# Patient Record
Sex: Female | Born: 1955
Health system: Southern US, Community
[De-identification: ages and names within clinical notes are randomized; demographics above are authoritative.]

## PROBLEM LIST (undated history)

## (undated) DIAGNOSIS — G4730 Sleep apnea, unspecified: Secondary | ICD-10-CM

## (undated) DIAGNOSIS — I2510 Atherosclerotic heart disease of native coronary artery without angina pectoris: Secondary | ICD-10-CM

## (undated) DIAGNOSIS — N289 Disorder of kidney and ureter, unspecified: Secondary | ICD-10-CM

## (undated) DIAGNOSIS — F419 Anxiety disorder, unspecified: Secondary | ICD-10-CM

## (undated) DIAGNOSIS — J449 Chronic obstructive pulmonary disease, unspecified: Secondary | ICD-10-CM

## (undated) HISTORY — PX: CHOLECYSTECTOMY: SHX55

## (undated) HISTORY — PX: OTHER SURGICAL HISTORY: SHX169

## (undated) HISTORY — PX: APPENDECTOMY: SHX54

---

## 2010-09-15 HISTORY — PX: CORONARY ANGIOPLASTY: SHX604

## 2016-03-08 DIAGNOSIS — K56609 Unspecified intestinal obstruction, unspecified as to partial versus complete obstruction: Secondary | ICD-10-CM

## 2016-03-08 HISTORY — DX: Unspecified intestinal obstruction, unspecified as to partial versus complete obstruction: K56.609

## 2016-04-24 DIAGNOSIS — N200 Calculus of kidney: Secondary | ICD-10-CM

## 2016-04-24 DIAGNOSIS — N390 Urinary tract infection, site not specified: Secondary | ICD-10-CM

## 2016-04-24 HISTORY — DX: Urinary tract infection, site not specified: N39.0

## 2016-04-24 HISTORY — DX: Calculus of kidney: N20.0

## 2020-04-20 DIAGNOSIS — E7800 Pure hypercholesterolemia, unspecified: Secondary | ICD-10-CM | POA: Diagnosis not present

## 2020-04-20 DIAGNOSIS — E782 Mixed hyperlipidemia: Secondary | ICD-10-CM | POA: Diagnosis not present

## 2020-04-20 DIAGNOSIS — E785 Hyperlipidemia, unspecified: Secondary | ICD-10-CM | POA: Diagnosis not present

## 2020-04-20 DIAGNOSIS — H409 Unspecified glaucoma: Secondary | ICD-10-CM | POA: Diagnosis not present

## 2020-06-14 DIAGNOSIS — H6122 Impacted cerumen, left ear: Secondary | ICD-10-CM | POA: Diagnosis not present

## 2020-06-14 DIAGNOSIS — J40 Bronchitis, not specified as acute or chronic: Secondary | ICD-10-CM | POA: Diagnosis not present

## 2020-06-14 DIAGNOSIS — H9192 Unspecified hearing loss, left ear: Secondary | ICD-10-CM | POA: Diagnosis not present

## 2020-06-14 DIAGNOSIS — R059 Cough, unspecified: Secondary | ICD-10-CM | POA: Diagnosis not present

## 2020-08-19 DIAGNOSIS — Z720 Tobacco use: Secondary | ICD-10-CM | POA: Diagnosis not present

## 2020-08-19 DIAGNOSIS — I2510 Atherosclerotic heart disease of native coronary artery without angina pectoris: Secondary | ICD-10-CM | POA: Diagnosis not present

## 2020-08-19 DIAGNOSIS — J449 Chronic obstructive pulmonary disease, unspecified: Secondary | ICD-10-CM | POA: Diagnosis not present

## 2020-08-19 DIAGNOSIS — N1831 Chronic kidney disease, stage 3a: Secondary | ICD-10-CM | POA: Diagnosis not present

## 2020-09-01 DIAGNOSIS — J069 Acute upper respiratory infection, unspecified: Secondary | ICD-10-CM | POA: Diagnosis not present

## 2020-09-09 DIAGNOSIS — Z20822 Contact with and (suspected) exposure to covid-19: Secondary | ICD-10-CM | POA: Diagnosis not present

## 2020-09-09 DIAGNOSIS — J189 Pneumonia, unspecified organism: Secondary | ICD-10-CM | POA: Diagnosis not present

## 2020-09-09 DIAGNOSIS — R509 Fever, unspecified: Secondary | ICD-10-CM | POA: Diagnosis not present

## 2020-09-09 DIAGNOSIS — R0602 Shortness of breath: Secondary | ICD-10-CM | POA: Diagnosis not present

## 2020-09-09 DIAGNOSIS — J441 Chronic obstructive pulmonary disease with (acute) exacerbation: Secondary | ICD-10-CM | POA: Diagnosis not present

## 2020-09-27 DIAGNOSIS — Z01419 Encounter for gynecological examination (general) (routine) without abnormal findings: Secondary | ICD-10-CM | POA: Diagnosis not present

## 2020-09-27 DIAGNOSIS — Z1231 Encounter for screening mammogram for malignant neoplasm of breast: Secondary | ICD-10-CM | POA: Diagnosis not present

## 2020-09-27 DIAGNOSIS — F17210 Nicotine dependence, cigarettes, uncomplicated: Secondary | ICD-10-CM | POA: Diagnosis not present

## 2020-10-24 DIAGNOSIS — Z8701 Personal history of pneumonia (recurrent): Secondary | ICD-10-CM | POA: Diagnosis not present

## 2020-10-24 DIAGNOSIS — M545 Low back pain, unspecified: Secondary | ICD-10-CM | POA: Diagnosis not present

## 2020-10-24 DIAGNOSIS — F419 Anxiety disorder, unspecified: Secondary | ICD-10-CM | POA: Diagnosis not present

## 2020-10-24 DIAGNOSIS — I10 Essential (primary) hypertension: Secondary | ICD-10-CM | POA: Diagnosis not present

## 2020-11-07 DIAGNOSIS — J449 Chronic obstructive pulmonary disease, unspecified: Secondary | ICD-10-CM | POA: Diagnosis not present

## 2020-12-08 DIAGNOSIS — J449 Chronic obstructive pulmonary disease, unspecified: Secondary | ICD-10-CM | POA: Diagnosis not present

## 2020-12-13 DIAGNOSIS — R059 Cough, unspecified: Secondary | ICD-10-CM | POA: Diagnosis not present

## 2020-12-13 DIAGNOSIS — J029 Acute pharyngitis, unspecified: Secondary | ICD-10-CM | POA: Diagnosis not present

## 2020-12-13 DIAGNOSIS — Z20822 Contact with and (suspected) exposure to covid-19: Secondary | ICD-10-CM | POA: Diagnosis not present

## 2020-12-13 DIAGNOSIS — R0602 Shortness of breath: Secondary | ICD-10-CM | POA: Diagnosis not present

## 2021-01-08 DIAGNOSIS — J449 Chronic obstructive pulmonary disease, unspecified: Secondary | ICD-10-CM | POA: Diagnosis not present

## 2021-01-24 DIAGNOSIS — R0609 Other forms of dyspnea: Secondary | ICD-10-CM | POA: Diagnosis not present

## 2021-01-24 DIAGNOSIS — Z8701 Personal history of pneumonia (recurrent): Secondary | ICD-10-CM | POA: Diagnosis not present

## 2021-01-24 DIAGNOSIS — F17210 Nicotine dependence, cigarettes, uncomplicated: Secondary | ICD-10-CM | POA: Diagnosis not present

## 2021-01-24 DIAGNOSIS — R9389 Abnormal findings on diagnostic imaging of other specified body structures: Secondary | ICD-10-CM | POA: Diagnosis not present

## 2021-01-25 DIAGNOSIS — M25551 Pain in right hip: Secondary | ICD-10-CM | POA: Diagnosis not present

## 2021-01-25 DIAGNOSIS — Z23 Encounter for immunization: Secondary | ICD-10-CM | POA: Diagnosis not present

## 2021-01-25 DIAGNOSIS — F419 Anxiety disorder, unspecified: Secondary | ICD-10-CM | POA: Diagnosis not present

## 2021-01-25 DIAGNOSIS — R519 Headache, unspecified: Secondary | ICD-10-CM | POA: Diagnosis not present

## 2021-01-30 DIAGNOSIS — A881 Epidemic vertigo: Secondary | ICD-10-CM | POA: Diagnosis not present

## 2021-01-30 DIAGNOSIS — R519 Headache, unspecified: Secondary | ICD-10-CM | POA: Diagnosis not present

## 2021-02-07 DIAGNOSIS — R9389 Abnormal findings on diagnostic imaging of other specified body structures: Secondary | ICD-10-CM | POA: Diagnosis not present

## 2021-02-07 DIAGNOSIS — J449 Chronic obstructive pulmonary disease, unspecified: Secondary | ICD-10-CM | POA: Diagnosis not present

## 2021-02-28 DIAGNOSIS — J84112 Idiopathic pulmonary fibrosis: Secondary | ICD-10-CM | POA: Diagnosis not present

## 2021-02-28 DIAGNOSIS — J849 Interstitial pulmonary disease, unspecified: Secondary | ICD-10-CM | POA: Diagnosis not present

## 2021-02-28 DIAGNOSIS — R9389 Abnormal findings on diagnostic imaging of other specified body structures: Secondary | ICD-10-CM | POA: Diagnosis not present

## 2021-02-28 DIAGNOSIS — R0600 Dyspnea, unspecified: Secondary | ICD-10-CM | POA: Diagnosis not present

## 2021-03-10 DIAGNOSIS — J449 Chronic obstructive pulmonary disease, unspecified: Secondary | ICD-10-CM | POA: Diagnosis not present

## 2021-04-09 DIAGNOSIS — J449 Chronic obstructive pulmonary disease, unspecified: Secondary | ICD-10-CM | POA: Diagnosis not present

## 2021-04-10 ENCOUNTER — Encounter (HOSPITAL_COMMUNITY): Payer: Self-pay

## 2021-04-10 ENCOUNTER — Emergency Department (HOSPITAL_COMMUNITY)
Admission: EM | Admit: 2021-04-10 | Discharge: 2021-04-10 | Disposition: A | Discharge status: Home / Self Care | Payer: Medicare Other | Attending: Emergency Medicine | Admitting: Emergency Medicine

## 2021-04-10 ENCOUNTER — Emergency Department (HOSPITAL_COMMUNITY): Payer: Medicare Other

## 2021-04-10 DIAGNOSIS — M545 Low back pain, unspecified: Secondary | ICD-10-CM | POA: Insufficient documentation

## 2021-04-10 DIAGNOSIS — R202 Paresthesia of skin: Secondary | ICD-10-CM | POA: Diagnosis not present

## 2021-04-10 DIAGNOSIS — M5136 Other intervertebral disc degeneration, lumbar region: Secondary | ICD-10-CM | POA: Diagnosis not present

## 2021-04-10 MED ORDER — LIDOCAINE 5 % EX PTCH: 1.0000 | MEDICATED_PATCH | CUTANEOUS | 0 refills | Status: DC

## 2021-04-10 MED ORDER — METHOCARBAMOL 500 MG PO TABS: 500.0000 mg | ORAL_TABLET | Freq: Two times a day (BID) | ORAL | 0 refills | Status: AC

## 2021-04-10 MED ORDER — KETOROLAC TROMETHAMINE 15 MG/ML IJ SOLN: 15.0000 mg | Freq: Once | INTRAMUSCULAR | Status: DC

## 2021-04-10 MED ORDER — PREDNISONE 10 MG (21) PO TBPK: ORAL_TABLET | Freq: Every day | ORAL | 0 refills | Status: DC

## 2021-04-10 MED ORDER — OXYCODONE-ACETAMINOPHEN 5-325 MG PO TABS: 1.0000 | ORAL_TABLET | Freq: Four times a day (QID) | ORAL | 0 refills | Status: AC | PRN

## 2021-04-10 MED ORDER — KETOROLAC TROMETHAMINE 15 MG/ML IJ SOLN
15.0000 mg | Freq: Once | INTRAMUSCULAR | Status: AC
  Administered 2021-04-10: 16:00:00 15 mg via INTRAMUSCULAR
  Filled 2021-04-10: qty 1

## 2021-04-10 MED ORDER — HYDROCODONE-ACETAMINOPHEN 5-325 MG PO TABS
1.0000 | ORAL_TABLET | Freq: Once | ORAL | Status: AC
  Administered 2021-04-10: 16:00:00 1 via ORAL
  Filled 2021-04-10: qty 1

## 2021-04-10 NOTE — ED Provider Notes (Signed)
[facility] EMERGENCY DEPARTMENT Provider Note   CSN: [id number] Arrival date & time: 04/10/21  1144     History Chief Complaint  Patient presents with   Back Pain   Leg Pain    bilat    Andrea Houston is a 65 y.o. female.  65 year old female presents today for evaluation of low back pain that radiates down to her bilateral lower legs 66-month duration that worsened 2 months ago.  Patient reports 5 months ago she says she was getting in and out of her RV she had multiple falls until the had stairs placed.  She has been taking over-the-counter medicine with some relief.  She denies fever, IV drug use history, history of malignancy, saddle anesthesia, lack of bowel or bladder control.  She does report numbness and tingling in bilateral feet otherwise denies complaints.  She also reports over the past couple months she occasionally has falls because her legs give out.  These episodes are random.  Most recently occurred about a week ago.  The history is provided by the patient. No language interpreter was used.      No past medical history on file.  There are no problems to display for this patient.   History reviewed. No pertinent surgical history.   OB History   No obstetric history on file.     No family history on file.     Home Medications Prior to Admission medications   Medication Sig Start Date End Date Taking? Authorizing Provider  lidocaine (LIDODERM) 5 % Place 1 patch onto the skin daily. Remove & Discard patch within 12 hours or as directed by MD 04/10/21  Yes Deatra Canter, Vyron Fronczak, PA-C  methocarbamol (ROBAXIN) 500 MG tablet Take 1 tablet (500 mg total) by mouth 2 (two) times daily. 04/10/21  Yes Deatra Canter, Ajanae Virag, PA-C  oxyCODONE-acetaminophen (PERCOCET/ROXICET) 5-325 MG tablet Take 1 tablet by mouth every 6 (six) hours as needed for up to 3 days for severe pain. 04/10/21 04/13/21 Yes Orvis Stann, PA-C  predniSONE (STERAPRED UNI-PAK 21 TAB) 10 MG (21) TBPK tablet Take by mouth  daily. Take 6 tabs by mouth daily  for 2 days, then 5 tabs for 2 days, then 4 tabs for 2 days, then 3 tabs for 2 days, 2 tabs for 2 days, then 1 tab by mouth daily for 2 days 04/10/21  Yes Deatra Canter, Kathalina Ostermann, PA-C    Allergies    Patient has no known allergies.  Review of Systems   Review of Systems  Constitutional:  Negative for chills and fever.  Respiratory:  Negative for shortness of breath.   Gastrointestinal:  Negative for nausea and vomiting.  Genitourinary:  Negative for difficulty urinating and dysuria.  Musculoskeletal:  Positive for back pain and gait problem.  Neurological:  Positive for numbness. Negative for weakness.  All other systems reviewed and are negative.  Physical Exam Updated Vital Signs BP (!) 105/93    Pulse 89    Temp 99.1 F (37.3 C) (Oral)    Resp 18    Ht 5\' 1"  (1.549 m)    Wt 67.1 kg    SpO2 96%    BMI 27.96 kg/m   Physical Exam Vitals and nursing note reviewed.  Constitutional:      General: She is not in acute distress.    Appearance: Normal appearance. She is not ill-appearing.  HENT:     Head: Normocephalic and atraumatic.     Nose: Nose normal.  Eyes:     General:  No scleral icterus.    Extraocular Movements: Extraocular movements intact.     Conjunctiva/sclera: Conjunctivae normal.  Cardiovascular:     Rate and Rhythm: Normal rate and regular rhythm.     Pulses: Normal pulses.     Heart sounds: Normal heart sounds.  Pulmonary:     Effort: Pulmonary effort is normal. No respiratory distress.     Breath sounds: Normal breath sounds. No wheezing or rales.  Abdominal:     General: There is no distension.     Tenderness: There is no abdominal tenderness.  Musculoskeletal:        General: Normal range of motion.     Cervical back: Normal range of motion.     Comments: Cervical and thoracic spine without tenderness palpation.  Lumbar spine with mild tenderness to palpation.  Right lumbar paraspinal muscles with significant tenderness to palpation  present.  Full hip, knee, ankle range of motion present in bilateral lower extremities.  5/5 strength in hip, knee, ankle present bilaterally.  Sensation altered in bilateral feet states it feels like pins-and-needles, otherwise intact and symmetrical.  DP pulse 2+ and symmetrical.  Skin:    General: Skin is warm and dry.  Neurological:     General: No focal deficit present.     Mental Status: She is alert. Mental status is at baseline.    ED Results / Procedures / Treatments   Labs (all labs ordered are listed, but only abnormal results are displayed) Labs Reviewed - No data to display  EKG None  Radiology DG Lumbar Spine 2-3 Views  Result Date: 04/10/2021 CLINICAL DATA:  Five months of back and bilateral leg pain. EXAM: LUMBAR SPINE - 2-3 VIEW COMPARISON:  None. FINDINGS: There is no evidence of lumbar spine fracture. Five lumbar type vertebral bodies. Multilevel disc space narrowing and facet hypertrophy most notably at L5-S1 which produces neural foraminal impingement. Grade 1 L4 on L5 degenerative anterolisthesis. Cholecystectomy clips. Aortic atherosclerosis. IMPRESSION: Multilevel degenerative changes of the lumbar spine. No acute osseous abnormality. Electronically Signed   By: Dahlia Bailiff M.D.   On: 04/10/2021 16:04    Procedures Procedures   Medications Ordered in ED Medications  HYDROcodone-acetaminophen (NORCO/VICODIN) 5-325 MG per tablet 1 tablet (1 tablet Oral Given 04/10/21 1603)  ketorolac (TORADOL) 15 MG/ML injection 15 mg (15 mg Intramuscular Given 04/10/21 1603)    ED Course  I have reviewed the triage vital signs and the nursing notes.  Pertinent labs & imaging results that were available during my care of the patient were reviewed by me and considered in my medical decision making (see chart for details).    MDM Rules/Calculators/A&P                         65 year old female presents today for evaluation of back pain with numbness and tingling of  bilateral feet.  She describes muscle tightness in her anterior leg otherwise without sharp shooting pain down either of the legs.  Straight leg raise test negative bilaterally.  Given the occurrences of occasional falls, and numbness to her bilateral feet we will provide patient with neurosurgery follow-up.  Discussed importance of calling and scheduling an appointment soon as possible as well as following up with her primary care provider.  We will give patient Toradol prednisone taper along with muscle relaxer and pain medicine for pain relief.  Return precautions discussed with patient husband at length.  They both voiced understanding and are in agreement  with plan. Case discussed with Dr. Rubin Payor.      Final Clinical Impression(s) / ED Diagnoses Final diagnoses:  Acute right-sided low back pain without sciatica    Rx / DC Orders ED Discharge Orders          Ordered    predniSONE (STERAPRED UNI-PAK 21 TAB) 10 MG (21) TBPK tablet  Daily        04/10/21 1627    oxyCODONE-acetaminophen (PERCOCET/ROXICET) 5-325 MG tablet  Every 6 hours PRN        04/10/21 1627    methocarbamol (ROBAXIN) 500 MG tablet  2 times daily        04/10/21 1627    lidocaine (LIDODERM) 5 %  Every 24 hours        04/10/21 1627             Marita Kansas, PA-C 04/10/21 1706    Benjiman Core, MD 04/11/21 [phone]

## 2021-04-10 NOTE — ED Triage Notes (Signed)
Pt to ED via Triage c/o back pain and leg pain bilaterally over the past 5 months. Reports fell out of a boat aprox 5 months ago and this is when symptoms started.  Intermittent tingling and pain; which is worse  with ambulation.

## 2021-04-10 NOTE — Discharge Instructions (Signed)
Your x-ray did show some narrowing of your spine.  I have sent in a few medications for you to help with your back pain.  If your symptoms worsen, develop weakness, numbness in your groin, difficulty controlling your bowel or bladder please return to the emergency room.  Otherwise I have attached neurosurgery follow-up for you above.  Please give them a call to schedule an in clinic follow-up appointment.  In the meantime you can also follow with your primary care doctor to be reevaluated.

## 2021-04-11 NOTE — ED Provider Notes (Signed)
Received call from pharmacist, who was concerned that the patient got a prescription for oxycodone and is chronically on hydrocodone.  It does not appear that the provider recognized that, as there is no documentation in the note.  I asked the pharmacist to not fill the oxycodone and instructed patient to take her usual prescribed medication along with a new prescription for prednisone and Robaxin.   Mancel Bale, MD 04/12/21 1101

## 2021-04-18 DIAGNOSIS — M5416 Radiculopathy, lumbar region: Secondary | ICD-10-CM | POA: Diagnosis not present

## 2021-04-24 DIAGNOSIS — R0602 Shortness of breath: Secondary | ICD-10-CM | POA: Diagnosis not present

## 2021-04-24 DIAGNOSIS — J188 Other pneumonia, unspecified organism: Secondary | ICD-10-CM | POA: Diagnosis not present

## 2021-04-24 DIAGNOSIS — R051 Acute cough: Secondary | ICD-10-CM | POA: Diagnosis not present

## 2021-04-28 DIAGNOSIS — M5416 Radiculopathy, lumbar region: Secondary | ICD-10-CM | POA: Diagnosis not present

## 2021-05-09 DIAGNOSIS — M5416 Radiculopathy, lumbar region: Secondary | ICD-10-CM | POA: Diagnosis not present

## 2021-05-16 DIAGNOSIS — E785 Hyperlipidemia, unspecified: Secondary | ICD-10-CM | POA: Diagnosis not present

## 2021-05-16 DIAGNOSIS — Z79899 Other long term (current) drug therapy: Secondary | ICD-10-CM | POA: Diagnosis not present

## 2021-05-16 DIAGNOSIS — I10 Essential (primary) hypertension: Secondary | ICD-10-CM | POA: Diagnosis not present

## 2021-05-16 DIAGNOSIS — J849 Interstitial pulmonary disease, unspecified: Secondary | ICD-10-CM | POA: Diagnosis not present

## 2021-05-16 DIAGNOSIS — F419 Anxiety disorder, unspecified: Secondary | ICD-10-CM | POA: Diagnosis not present

## 2021-05-25 DIAGNOSIS — U099 Post covid-19 condition, unspecified: Secondary | ICD-10-CM | POA: Diagnosis not present

## 2021-05-25 DIAGNOSIS — J849 Interstitial pulmonary disease, unspecified: Secondary | ICD-10-CM | POA: Diagnosis not present

## 2021-05-25 DIAGNOSIS — F17210 Nicotine dependence, cigarettes, uncomplicated: Secondary | ICD-10-CM | POA: Diagnosis not present

## 2021-05-26 DIAGNOSIS — J849 Interstitial pulmonary disease, unspecified: Secondary | ICD-10-CM | POA: Diagnosis not present

## 2021-06-08 DIAGNOSIS — M5416 Radiculopathy, lumbar region: Secondary | ICD-10-CM | POA: Diagnosis not present

## 2021-06-10 DIAGNOSIS — J449 Chronic obstructive pulmonary disease, unspecified: Secondary | ICD-10-CM | POA: Diagnosis not present

## 2021-07-06 DIAGNOSIS — I10 Essential (primary) hypertension: Secondary | ICD-10-CM | POA: Diagnosis not present

## 2021-07-06 DIAGNOSIS — M25551 Pain in right hip: Secondary | ICD-10-CM | POA: Diagnosis not present

## 2021-07-06 DIAGNOSIS — M4316 Spondylolisthesis, lumbar region: Secondary | ICD-10-CM | POA: Diagnosis not present

## 2021-07-06 DIAGNOSIS — M5416 Radiculopathy, lumbar region: Secondary | ICD-10-CM | POA: Diagnosis not present

## 2021-07-08 DIAGNOSIS — J449 Chronic obstructive pulmonary disease, unspecified: Secondary | ICD-10-CM | POA: Diagnosis not present

## 2021-08-08 DIAGNOSIS — J449 Chronic obstructive pulmonary disease, unspecified: Secondary | ICD-10-CM | POA: Diagnosis not present

## 2021-08-11 DIAGNOSIS — M4316 Spondylolisthesis, lumbar region: Secondary | ICD-10-CM | POA: Diagnosis not present

## 2021-08-21 ENCOUNTER — Other Ambulatory Visit: Payer: Self-pay | Admitting: Neurological Surgery

## 2021-08-24 NOTE — Progress Notes (Signed)
Surgical Instructions ? ? ? Your procedure is scheduled on Monday May 15. ? Report to [facility] Main Entrance "A" at 9:00 A.M., then check in with the Admitting office. ? Call this number if you have problems the morning of surgery: ? [phone] ? ? If you have any questions prior to your surgery date call [phone]: Open Monday-Friday 8am-4pm ? ? ? Remember: ? Do not eat or drink anything after midnight the night before your surgery ?  ? Take these medicines the morning of surgery with A SIP OF WATER:  ?cyclobenzaprine (FLEXERIL) ?ezetimibe (ZETIA) ?methocarbamol (ROBAXIN)  ?metoprolol succinate (TOPROL-XL) ?nitrofurantoin, macrocrystal-monohydrate, (MACROBID)  ?pantoprazole (PROTONIX)  ?PARoxetine (PAXIL) 40 MG  ?TRELEGY ELLIPTA ? ?IF NEEDED take these medicines: ?HYDROcodone-acetaminophen (NORCO/VICODIN ?ipratropium-albuterol (DUONEB) 0.5-2.5  ?LORazepam (ATIVAN) ?ondansetron [facility])  ?oxyCODONE-acetaminophen (PERCOCET/ROXICET) ?VENTOLIN HFA 108 (90 Base) MCG/ACT inhaler ? ? ?As of today, STOP taking any Aspirin (unless otherwise instructed by your surgeon) Aleve, Naproxen, Ibuprofen, Motrin, Advil, Goody's, BC's, all herbal medications, fish oil, and all vitamins. ? ?         ?Do not wear jewelry or makeup ?Do not wear lotions, powders, perfumes/colognes, or deodorant. ?Do not shave 48 hours prior to surgery.  Men may shave face and neck. ?Do not bring valuables to the hospital. ?Do not wear nail polish, gel polish, artificial nails, or any other type of covering on natural nails (fingers and toes) ?If you have artificial nails or gel coating that need to be removed by a nail salon, please have this removed prior to surgery. Artificial nails or gel coating may interfere with anesthesia's ability to adequately monitor your vital signs. ? ?[facility] is not responsible for any belongings or valuables. .  ? ?Do NOT Smoke (Tobacco/Vaping)  24 hours prior to your procedure ? ?If you use a CPAP at night, you  may bring your mask for your overnight stay. ?  ?Contacts, glasses, hearing aids, dentures or partials may not be worn into surgery, please bring cases for these belongings ?  ?For patients admitted to the hospital, discharge time will be determined by your treatment team. ?  ?Patients discharged the day of surgery will not be allowed to drive home, and someone needs to stay with them for 24 hours. ? ? ?SURGICAL WAITING ROOM VISITATION ?Patients having surgery or a procedure in a hospital may have two support people. ?Children under the age of 56 must have an adult with them who is not the patient. ?They may stay in the waiting area during the procedure and may switch out with other visitors. If the patient needs to stay at the hospital during part of their recovery, the visitor guidelines for inpatient rooms apply. ? ?Please refer to the [facility] website for the visitor guidelines for Inpatients (after your surgery is over and you are in a regular room).  ? ? ? ? ? ?Special instructions:   ? ?Oral Hygiene is also important to reduce your risk of infection.  Remember - BRUSH YOUR TEETH THE MORNING OF SURGERY WITH YOUR REGULAR TOOTHPASTE ? ? ?Andrea [facility]- Preparing For Surgery ? ?Before surgery, you can play an important role. Because skin is not sterile, your skin needs to be as free of germs as possible. You can reduce the number of germs on your skin by washing with CHG (chlorahexidine gluconate) Soap before surgery.  CHG is an antiseptic cleaner which kills germs and bonds with the skin to continue killing germs even after washing.   ? ? ?  Please do not use if you have an allergy to CHG or antibacterial soaps. If your skin becomes reddened/irritated stop using the CHG.  ?Do not shave (including legs and underarms) for at least 48 hours prior to first CHG shower. It is OK to shave your face. ? ?Please follow these instructions carefully. ?  ? ? Shower the NIGHT BEFORE SURGERY and the MORNING OF SURGERY with CHG  Soap.  ? If you chose to wash your hair, wash your hair first as usual with your normal shampoo. After you shampoo, rinse your hair and body thoroughly to remove the shampoo.  Then [employer] and genitals (private parts) with your normal soap and rinse thoroughly to remove soap. ? ?After that Use CHG Soap as you would any other liquid soap. You can apply CHG directly to the skin and wash gently with a scrungie or a clean washcloth.  ? ?Apply the CHG Soap to your body ONLY FROM THE NECK DOWN.  Do not use on open wounds or open sores. Avoid contact with your eyes, ears, mouth and genitals (private parts). Wash Face and genitals (private parts)  with your normal soap.  ? ?Wash thoroughly, paying special attention to the area where your surgery will be performed. ? ?Thoroughly rinse your body with warm water from the neck down. ? ?DO NOT shower/wash with your normal soap after using and rinsing off the CHG Soap. ? ?Pat yourself dry with a CLEAN TOWEL. ? ?Wear CLEAN PAJAMAS to bed the night before surgery ? ?Place CLEAN SHEETS on your bed the night before your surgery ? ?DO NOT SLEEP WITH PETS. ? ? ?Day of Surgery: ? ?Take a shower with CHG soap. ?Wear Clean/Comfortable clothing the morning of surgery ?Do not apply any deodorants/lotions.   ?Remember to brush your teeth WITH YOUR REGULAR TOOTHPASTE. ? ? ? ?If you received a COVID test during your pre-op visit, it is requested that you wear a mask when out in public, stay away from anyone that may not be feeling well, and notify your surgeon if you develop symptoms. If you have been in contact with anyone that has tested positive in the last 10 days, please notify your surgeon. ? ?  ?Please read over the following fact sheets that you were given.   ?

## 2021-08-25 ENCOUNTER — Encounter (HOSPITAL_COMMUNITY)
Admission: RE | Admit: 2021-08-25 | Discharge: 2021-08-25 | Disposition: A | Discharge status: Home / Self Care | Payer: Medicare Other | Source: Ambulatory Visit | Attending: Neurological Surgery | Admitting: Neurological Surgery

## 2021-08-25 ENCOUNTER — Encounter (HOSPITAL_COMMUNITY): Payer: Self-pay

## 2021-08-25 ENCOUNTER — Other Ambulatory Visit: Payer: Self-pay

## 2021-08-25 VITALS — BP 117/77 | HR 75 | Temp 98.3°F | Resp 18 | Ht 61.0 in | Wt 151.7 lb

## 2021-08-25 DIAGNOSIS — E781 Pure hyperglyceridemia: Secondary | ICD-10-CM | POA: Diagnosis not present

## 2021-08-25 DIAGNOSIS — K56609 Unspecified intestinal obstruction, unspecified as to partial versus complete obstruction: Secondary | ICD-10-CM | POA: Insufficient documentation

## 2021-08-25 DIAGNOSIS — Z01818 Encounter for other preprocedural examination: Secondary | ICD-10-CM | POA: Diagnosis not present

## 2021-08-25 DIAGNOSIS — M4326 Fusion of spine, lumbar region: Secondary | ICD-10-CM | POA: Diagnosis not present

## 2021-08-25 DIAGNOSIS — M5127 Other intervertebral disc displacement, lumbosacral region: Secondary | ICD-10-CM | POA: Diagnosis not present

## 2021-08-25 DIAGNOSIS — M4316 Spondylolisthesis, lumbar region: Secondary | ICD-10-CM | POA: Diagnosis not present

## 2021-08-25 DIAGNOSIS — Z87891 Personal history of nicotine dependence: Secondary | ICD-10-CM | POA: Insufficient documentation

## 2021-08-25 DIAGNOSIS — F419 Anxiety disorder, unspecified: Secondary | ICD-10-CM | POA: Diagnosis not present

## 2021-08-25 DIAGNOSIS — J189 Pneumonia, unspecified organism: Secondary | ICD-10-CM | POA: Insufficient documentation

## 2021-08-25 DIAGNOSIS — Z9049 Acquired absence of other specified parts of digestive tract: Secondary | ICD-10-CM | POA: Insufficient documentation

## 2021-08-25 DIAGNOSIS — M48061 Spinal stenosis, lumbar region without neurogenic claudication: Secondary | ICD-10-CM | POA: Diagnosis not present

## 2021-08-25 DIAGNOSIS — K219 Gastro-esophageal reflux disease without esophagitis: Secondary | ICD-10-CM | POA: Diagnosis not present

## 2021-08-25 DIAGNOSIS — Z955 Presence of coronary angioplasty implant and graft: Secondary | ICD-10-CM | POA: Diagnosis not present

## 2021-08-25 DIAGNOSIS — M5137 Other intervertebral disc degeneration, lumbosacral region: Secondary | ICD-10-CM | POA: Diagnosis not present

## 2021-08-25 DIAGNOSIS — J440 Chronic obstructive pulmonary disease with acute lower respiratory infection: Secondary | ICD-10-CM | POA: Insufficient documentation

## 2021-08-25 DIAGNOSIS — M4807 Spinal stenosis, lumbosacral region: Secondary | ICD-10-CM | POA: Diagnosis not present

## 2021-08-25 DIAGNOSIS — G4733 Obstructive sleep apnea (adult) (pediatric): Secondary | ICD-10-CM | POA: Diagnosis not present

## 2021-08-25 DIAGNOSIS — I2510 Atherosclerotic heart disease of native coronary artery without angina pectoris: Secondary | ICD-10-CM | POA: Diagnosis not present

## 2021-08-25 DIAGNOSIS — Z981 Arthrodesis status: Secondary | ICD-10-CM | POA: Diagnosis not present

## 2021-08-25 DIAGNOSIS — M5136 Other intervertebral disc degeneration, lumbar region: Secondary | ICD-10-CM | POA: Diagnosis not present

## 2021-08-25 DIAGNOSIS — M5126 Other intervertebral disc displacement, lumbar region: Secondary | ICD-10-CM | POA: Diagnosis not present

## 2021-08-25 DIAGNOSIS — Z885 Allergy status to narcotic agent status: Secondary | ICD-10-CM | POA: Diagnosis not present

## 2021-08-25 DIAGNOSIS — J449 Chronic obstructive pulmonary disease, unspecified: Secondary | ICD-10-CM | POA: Diagnosis not present

## 2021-08-25 HISTORY — DX: Chronic obstructive pulmonary disease, unspecified: J44.9

## 2021-08-25 HISTORY — DX: Sleep apnea, unspecified: G47.30

## 2021-08-25 HISTORY — DX: Atherosclerotic heart disease of native coronary artery without angina pectoris: I25.10

## 2021-08-25 HISTORY — DX: Anxiety disorder, unspecified: F41.9

## 2021-08-25 HISTORY — DX: Disorder of kidney and ureter, unspecified: N28.9

## 2021-08-25 LAB — BASIC METABOLIC PANEL
Anion gap: 9 (ref 5–15)
BUN: 17 mg/dL (ref 8–23)
CO2: 28 mmol/L (ref 22–32)
Calcium: 9.6 mg/dL (ref 8.9–10.3)
Chloride: 104 mmol/L (ref 98–111)
Creatinine, Ser: 1.17 mg/dL — ABNORMAL HIGH (ref 0.44–1.00)
GFR, Estimated: 52 mL/min — ABNORMAL LOW (ref >60)
Glucose, Bld: 105 mg/dL — ABNORMAL HIGH (ref 70–99)
Potassium: 3.9 mmol/L (ref 3.5–5.1)
Sodium: 141 mmol/L (ref 135–145)

## 2021-08-25 LAB — CBC
HCT: 43.4 % (ref 36.0–46.0)
Hemoglobin: 13.6 g/dL (ref 12.0–15.0)
MCH: 27.7 pg (ref 26.0–34.0)
MCHC: 31.3 g/dL (ref 30.0–36.0)
MCV: 88.4 fL (ref 80.0–100.0)
Platelets: 258 10*3/uL (ref 150–400)
RBC: 4.91 MIL/uL (ref 3.87–5.11)
RDW: 14.5 % (ref 11.5–15.5)
WBC: 8.7 10*3/uL (ref 4.0–10.5)
nRBC: 0 % (ref 0.0–0.2)

## 2021-08-25 LAB — PROTIME-INR
INR: 1.1 (ref 0.8–1.2)
Prothrombin Time: 14.4 seconds (ref 11.4–15.2)

## 2021-08-25 LAB — TYPE AND SCREEN
ABO/RH(D): O POS
Antibody Screen: NEGATIVE

## 2021-08-25 LAB — SURGICAL PCR SCREEN
MRSA, PCR: NEGATIVE
Staphylococcus aureus: NEGATIVE

## 2021-08-25 NOTE — Progress Notes (Incomplete)
PCP - Georgana Curio, FNP ?Cardiologist - Was seeing Timmothy Euler with [facility], Needs to find a new cardiologist he has moved to [place]. ? ?PPM/ICD - Denies ? ?Chest x-ray - Not indicated ?EKG - 08/25/21 ?Stress Test - 05/18/21 ?ECHO -  ?Cardiac Cath - Yes has had three stents in [facility] ? ?Sleep Study -  ?CPAP -  ? ?Fasting Blood Sugar -  ?Checks Blood Sugar _____ times a day ? ?Blood Thinner Instructions: ?Aspirin Instructions: ? ?ERAS Protcol - ?PRE-SURGERY Ensure or G2-  ? ?COVID TEST-  ? ? ?Anesthesia review:  ? ?Patient denies shortness of breath, fever, cough and chest pain at PAT appointment ? ? ?All instructions explained to the patient, with a verbal understanding of the material. Patient agrees to go over the instructions while at home for a better understanding. Patient also instructed to self quarantine after being tested for COVID-19. The opportunity to ask questions was provided. ? ? ?

## 2021-08-25 NOTE — Progress Notes (Addendum)
Anesthesia Chart Review:  ? Case: [id number] Date/Time: 08/28/21 1047  ? Procedure: PLIF - L4-L5 - L5-S1 - Posterior Lateral and Interbody fusion (Back)  ? Anesthesia type: General  ? Pre-op diagnosis: Spondylolisthesis  ? Location: [facility] OR ROOM 20 / [facility] OR  ? Surgeons: Eustace Moore, MD  ? ?  ? ? ?DISCUSSION: Patient is a 66 year old female scheduled for the above procedure.  ? ?History includes former smoker (quit 07/15/21), COPD, CAD (s/p 3 stents 2012) , OSA (occasional CPAP), renal insufficieny, small bowel obstruction, appendectomy, cholecystectomy.  ? ?She reportedly had a stress test in February 2023. Cardiology preoperative input obtained by surgeon. Per 07/12/19 note by Dr. Lenox Ahr, "patient is at an acceptable risk to proceed with surgery without further testing.  It is reasonable to hold her Plavix for 7 days and use aspirin while holding Plavix and restart Plavix without aspirin after surgery." I spoke with nurse Sharrie Rothman in [facility] and she will fax additional records, so they will arrive before Monday's surgery ? ?Will plan to update note once records received. [UPDATE 08/25/21 4:50 PM: Still awaiting cardiology records. Surgery is not a first case, so can plan to have staff follow-up if not received by 08/28/21 arrival time.) ? ? ?ADDENDUM 08/28/21 9:20 AM:  ?Records received this morning for [facility]. Office note received was from 08/19/20, EKG from 09/09/20, stress test from 01/21/20, and CXR from 10/24/20 (in the setting of PNA).  Results outlined below. He notes that she had 3 stents to her mid RCA in 2012 by Dr. Doyce Loose in [place]. Dr. Bartholome Bill dicussed coming off Plavix, but she preferred no changes. Also he thought it was reasonable to continue given her smoking and ongoing elevated triglycerides. She was asymptomatic of her CAD then. He encouraged smoking cessation.  ? ? ?VS: BP 117/77   Pulse 75   Temp 36.8 ?C (Oral)   Resp 18   Ht 5\' 1"  (1.549 m)   Wt 68.8 kg   SpO2 97%   BMI 28.66 kg/m?   ? ?PROVIDERS: ?Tempie Hoist, FNP is PCP  ?Clarene Critchley, MD is cardiologist ([place]) Phone [phone].   ? ? ?LABS: Labs reviewed: Acceptable for surgery. ?(all labs ordered are listed, but only abnormal results are displayed) ? ?Labs Reviewed  ?BASIC METABOLIC PANEL - Abnormal; Notable for the following components:  ?    Result Value  ? Glucose, Bld 105 (*)   ? Creatinine, Ser 1.17 (*)   ? GFR, Estimated 52 (*)   ? All other components within normal limits  ?SURGICAL PCR SCREEN  ?PROTIME-INR  ?CBC  ?TYPE AND SCREEN  ? ? ?IMAGES: ?Xray L-spine 04/10/21: ?FINDINGS: ?There is no evidence of lumbar spine fracture. Five lumbar type ?vertebral bodies. Multilevel disc space narrowing and facet ?hypertrophy most notably at L5-S1 which produces neural foraminal ?impingement. Grade 1 L4 on L5 degenerative anterolisthesis. ?Cholecystectomy clips. Aortic atherosclerosis. ?IMPRESSION: ?Multilevel degenerative changes of the lumbar spine. No acute ?osseous abnormality. ? ?CXR 10/24/20 [facility]; in setting of PNA): ?Findings: ?The heart and mediastinum are within normal limits with regard to size.  Central pulmonary vasculature is unremarkable.  Increased parenchymal markings at the left lung base.  Interstitial infiltrates bilaterally.  No effusion or radiologically evident pneumothorax.  Visible osseous structures demonstrate no evidence of acute disease. ?Impression:  ?Increased parenchymal markings at the left lung base, most concerning for developing infiltrate. ? ? ?EKG: 08/25/21: ?Normal sinus rhythm ?Cannot rule out Anterior infarct , age undetermined ?  Abnormal ECG ?- Overall, I think EKG appears stable when compared to 11/09/20 tracing from [place].  ? ? ?CV:  ?Nuclear stress test 01/21/20 ([facility]): ?Impressions: ?Normal study.  No evidence of ischemia or infarction.  The left ventricular ejection fraction is 80%.  Left ventricular ejection fraction is within normal limits by visual estimate.  No  left ventricular regional wall motion abnormality. ?  ?Past Medical History:  ?Diagnosis Date  ? Anxiety   ? CAD (coronary artery disease)   ? COPD (chronic obstructive pulmonary disease) ([place])   ? Renal insufficiency   ? SBO (small bowel obstruction) ([place]) 03/08/2016  ? Sleep apnea   ? Uric acid nephrolithiasis 04/24/2016  ? Urinary tract infection 04/24/2016  ? ? ?Past Surgical History:  ?Procedure Laterality Date  ? APPENDECTOMY    ? CHOLECYSTECTOMY    ? CORONARY ANGIOPLASTY  09/2010  ? with stent placement  ? PR cysto/uretero w/lithotripsy    ? indwelling stent insertion, stents have since been removed  ? ? ?MEDICATIONS: ? Cholecalciferol (VITAMIN D3 PO)  ? Choline Fenofibrate (FENOFIBRIC ACID) 135 MG CPDR  ? cloNIDine (CATAPRES) 0.1 MG tablet  ? clopidogrel (PLAVIX) 75 MG tablet  ? cyclobenzaprine (FLEXERIL) 5 MG tablet  ? ezetimibe (ZETIA) 10 MG tablet  ? furosemide (LASIX) 40 MG tablet  ? HYDROcodone-acetaminophen (NORCO/VICODIN) 5-325 MG tablet  ? ipratropium-albuterol (DUONEB) 0.5-2.5 (3) MG/3ML SOLN  ? Krill Oil 500 MG CAPS  ? LORazepam (ATIVAN) 0.5 MG tablet  ? methocarbamol (ROBAXIN) 500 MG tablet  ? metoprolol succinate (TOPROL-XL) 50 MG 24 hr tablet  ? nitrofurantoin, macrocrystal-monohydrate, (MACROBID) 100 MG capsule  ? nystatin (MYCOSTATIN) 100000 UNIT/ML suspension  ? ondansetron (ZOFRAN) 4 MG tablet  ? oxyCODONE-acetaminophen (PERCOCET/ROXICET) 5-325 MG tablet  ? pantoprazole (PROTONIX) 40 MG tablet  ? PARoxetine (PAXIL) 20 MG tablet  ? PARoxetine (PAXIL) 40 MG tablet  ? POTASSIUM PO  ? promethazine-dextromethorphan (PROMETHAZINE-DM) 6.25-15 MG/5ML syrup  ? rosuvastatin (CRESTOR) 40 MG tablet  ? temazepam (RESTORIL) 30 MG capsule  ? TRELEGY ELLIPTA 100-62.5-25 MCG/ACT AEPB  ? VENTOLIN HFA 108 (90 Base) MCG/ACT inhaler  ? ?No current facility-administered medications for this encounter.  ? ? ?Myra Gianotti, PA-C ?Surgical Short Stay/Anesthesiology ?[facility] Phone [phone] ?[facility] Phone [phone] ?08/25/2021 3:12 PM ? ? ? ? ? ? ? ?

## 2021-08-25 NOTE — Progress Notes (Signed)
PCP - Georgana Curio FNP ?Cardiologist - Dr. Timmothy Euler with [facility] has since left the practice.   ? ?PPM/ICD - Denies ?Device Orders -  ?Rep Notified -  ? ?Chest x-ray - Not indicated ?EKG - 08/25/21 ?Stress Test - 05/18/21 ?ECHO - Denies ?Cardiac Cath - yes has 3 stents from [facility] ? ?Sleep Study - Yes has OSA ?CPAP - Uses sometimes ? ?DM - Denies ? ?Blood Thinner Instructions: Plavix per patient instructed to stop 7 days prior to surgery ? ?COVID TEST- N/A ? ? ?Anesthesia review: Yes cardiac history ? ?Patient denies shortness of breath, fever, cough and chest pain at PAT appointment.  Patient uses oxygen as needed at home. Has COPD. ? ? ?All instructions explained to the patient, with a verbal understanding of the material. Patient agrees to go over the instructions while at home for a better understanding.  The opportunity to ask questions was provided. ? ? ?

## 2021-08-25 NOTE — Anesthesia Preprocedure Evaluation (Addendum)
Anesthesia Evaluation  ?Patient identified by MRN, date of birth, ID band ?Patient awake ? ? ? ?Reviewed: ?Allergy & Precautions, NPO status , Patient's Chart, lab work & pertinent test results, reviewed documented beta blocker date and time  ? ?History of Anesthesia Complications ?Negative for: history of anesthetic complications ? ?Airway ?Mallampati: II ? ?TM Distance: >3 FB ?Neck ROM: Full ? ? ? Dental ? ?(+) Dental Advisory Given, Chipped ?  ?Pulmonary ?sleep apnea and Continuous Positive Airway Pressure Ventilation , COPD,  COPD inhaler, former smoker,  ?  ?Pulmonary exam normal ? ? ? ? ? ? ? Cardiovascular ?+ CAD and + Cardiac Stents  ?Normal cardiovascular exam ? ? ?  ?Neuro/Psych ?PSYCHIATRIC DISORDERS Anxiety negative neurological ROS ?   ? GI/Hepatic ?Neg liver ROS, GERD  Medicated and Controlled,  ?Endo/Other  ?negative endocrine ROS ? Renal/GU ?Renal InsufficiencyRenal disease  ? ?  ?Musculoskeletal ?negative musculoskeletal ROS ?(+)  ? Abdominal ?  ?Peds ? Hematology ? ?On plavix, last dose 2 weeks ago ?   ?Anesthesia Other Findings ?See PAT note ? ? Reproductive/Obstetrics ? ?  ? ? ? ? ? ? ? ? ? ? ? ? ? ?  ?  ? ? ? ? ? ? ?Anesthesia Physical ?Anesthesia Plan ? ?ASA: 3 ? ?Anesthesia Plan: General  ? ?Post-op Pain Management: Tylenol PO (pre-op)* and Celebrex PO (pre-op)*  ? ?Induction: Intravenous ? ?PONV Risk Score and Plan: 3 and Treatment may vary due to age or medical condition, Ondansetron, Dexamethasone and Midazolam ? ?Airway Management Planned: Oral ETT ? ?Additional Equipment: None ? ?Intra-op Plan:  ? ?Post-operative Plan: Extubation in OR ? ?Informed Consent: I have reviewed the patients History and Physical, chart, labs and discussed the procedure including the risks, benefits and alternatives for the proposed anesthesia with the patient or authorized representative who has indicated his/her understanding and acceptance.  ? ? ? ?Dental advisory  given ? ?Plan Discussed with: CRNA and Anesthesiologist ? ?Anesthesia Plan Comments:   ? ? ? ? ?Anesthesia Quick Evaluation ? ?

## 2021-08-28 ENCOUNTER — Encounter (HOSPITAL_COMMUNITY): Payer: Self-pay

## 2021-08-28 ENCOUNTER — Inpatient Hospital Stay (HOSPITAL_COMMUNITY): Payer: Medicare Other

## 2021-08-28 ENCOUNTER — Encounter (HOSPITAL_COMMUNITY): Payer: Self-pay | Admitting: Neurological Surgery

## 2021-08-28 ENCOUNTER — Inpatient Hospital Stay (HOSPITAL_COMMUNITY): Payer: Medicare Other | Admitting: Anesthesiology

## 2021-08-28 ENCOUNTER — Encounter (HOSPITAL_COMMUNITY)
Admission: RE | Disposition: A | Discharge status: Home / Self Care | Payer: Self-pay | Source: Home / Self Care | Attending: Neurological Surgery

## 2021-08-28 ENCOUNTER — Inpatient Hospital Stay (HOSPITAL_COMMUNITY)
Admission: RE | Admit: 2021-08-28 | Discharge: 2021-08-30 | DRG: 455 | Disposition: A | Discharge status: Home Health | Payer: Medicare Other | Attending: Neurological Surgery | Admitting: Neurological Surgery

## 2021-08-28 ENCOUNTER — Inpatient Hospital Stay (HOSPITAL_COMMUNITY): Payer: Medicare Other | Admitting: Vascular Surgery

## 2021-08-28 ENCOUNTER — Other Ambulatory Visit: Payer: Self-pay

## 2021-08-28 DIAGNOSIS — Z87891 Personal history of nicotine dependence: Secondary | ICD-10-CM

## 2021-08-28 DIAGNOSIS — Z9049 Acquired absence of other specified parts of digestive tract: Secondary | ICD-10-CM

## 2021-08-28 DIAGNOSIS — M5127 Other intervertebral disc displacement, lumbosacral region: Secondary | ICD-10-CM | POA: Diagnosis present

## 2021-08-28 DIAGNOSIS — M4316 Spondylolisthesis, lumbar region: Secondary | ICD-10-CM

## 2021-08-28 DIAGNOSIS — M5136 Other intervertebral disc degeneration, lumbar region: Secondary | ICD-10-CM | POA: Diagnosis not present

## 2021-08-28 DIAGNOSIS — K219 Gastro-esophageal reflux disease without esophagitis: Secondary | ICD-10-CM | POA: Diagnosis present

## 2021-08-28 DIAGNOSIS — J449 Chronic obstructive pulmonary disease, unspecified: Secondary | ICD-10-CM | POA: Diagnosis not present

## 2021-08-28 DIAGNOSIS — F419 Anxiety disorder, unspecified: Secondary | ICD-10-CM | POA: Diagnosis present

## 2021-08-28 DIAGNOSIS — I2510 Atherosclerotic heart disease of native coronary artery without angina pectoris: Secondary | ICD-10-CM | POA: Diagnosis not present

## 2021-08-28 DIAGNOSIS — Z981 Arthrodesis status: Principal | ICD-10-CM

## 2021-08-28 DIAGNOSIS — G4733 Obstructive sleep apnea (adult) (pediatric): Secondary | ICD-10-CM | POA: Diagnosis present

## 2021-08-28 DIAGNOSIS — M5126 Other intervertebral disc displacement, lumbar region: Secondary | ICD-10-CM

## 2021-08-28 DIAGNOSIS — E781 Pure hyperglyceridemia: Secondary | ICD-10-CM | POA: Diagnosis not present

## 2021-08-28 DIAGNOSIS — Z885 Allergy status to narcotic agent status: Secondary | ICD-10-CM

## 2021-08-28 DIAGNOSIS — M48061 Spinal stenosis, lumbar region without neurogenic claudication: Secondary | ICD-10-CM | POA: Diagnosis present

## 2021-08-28 DIAGNOSIS — Z955 Presence of coronary angioplasty implant and graft: Secondary | ICD-10-CM | POA: Diagnosis not present

## 2021-08-28 DIAGNOSIS — Z01818 Encounter for other preprocedural examination: Secondary | ICD-10-CM

## 2021-08-28 DIAGNOSIS — M5137 Other intervertebral disc degeneration, lumbosacral region: Secondary | ICD-10-CM | POA: Diagnosis not present

## 2021-08-28 DIAGNOSIS — M4807 Spinal stenosis, lumbosacral region: Secondary | ICD-10-CM | POA: Diagnosis present

## 2021-08-28 LAB — ABO/RH: ABO/RH(D): O POS

## 2021-08-28 SURGERY — POSTERIOR LUMBAR FUSION 2 LEVEL
Anesthesia: General | Site: Back

## 2021-08-28 MED ORDER — PROPOFOL 10 MG/ML IV BOLUS
INTRAVENOUS | Status: DC | PRN
Start: 2021-08-28 — End: 2021-08-28
  Administered 2021-08-28: 120 mg via INTRAVENOUS

## 2021-08-28 MED ORDER — METOPROLOL SUCCINATE ER 50 MG PO TB24
50.0000 mg | ORAL_TABLET | Freq: Every morning | ORAL | Status: DC
  Administered 2021-08-29 – 2021-08-30 (×2): 50 mg via ORAL
  Filled 2021-08-28 (×2): qty 1

## 2021-08-28 MED ORDER — CELECOXIB 200 MG PO CAPS
200.0000 mg | ORAL_CAPSULE | Freq: Two times a day (BID) | ORAL | Status: DC
  Administered 2021-08-28 – 2021-08-30 (×4): 200 mg via ORAL
  Filled 2021-08-28 (×4): qty 1

## 2021-08-28 MED ORDER — LIDOCAINE 2% (20 MG/ML) 5 ML SYRINGE
INTRAMUSCULAR | Status: AC
  Filled 2021-08-28: qty 5

## 2021-08-28 MED ORDER — METHOCARBAMOL 500 MG PO TABS
500.0000 mg | ORAL_TABLET | Freq: Four times a day (QID) | ORAL | Status: DC | PRN
  Administered 2021-08-28 – 2021-08-30 (×6): 500 mg via ORAL
  Filled 2021-08-28 (×8): qty 1

## 2021-08-28 MED ORDER — ACETAMINOPHEN 500 MG PO TABS
1000.0000 mg | ORAL_TABLET | Freq: Four times a day (QID) | ORAL | Status: AC
  Administered 2021-08-28 – 2021-08-29 (×4): 1000 mg via ORAL
  Filled 2021-08-28 (×4): qty 2

## 2021-08-28 MED ORDER — ROCURONIUM BROMIDE 10 MG/ML (PF) SYRINGE
PREFILLED_SYRINGE | INTRAVENOUS | Status: AC
  Filled 2021-08-28: qty 10

## 2021-08-28 MED ORDER — PANTOPRAZOLE SODIUM 40 MG PO TBEC
40.0000 mg | DELAYED_RELEASE_TABLET | Freq: Every morning | ORAL | Status: DC
  Administered 2021-08-29 – 2021-08-30 (×2): 40 mg via ORAL
  Filled 2021-08-28 (×2): qty 1

## 2021-08-28 MED ORDER — SUGAMMADEX SODIUM 200 MG/2ML IV SOLN
INTRAVENOUS | Status: DC | PRN
  Administered 2021-08-28: 200 mg via INTRAVENOUS

## 2021-08-28 MED ORDER — THROMBIN 5000 UNITS EX SOLR
CUTANEOUS | Status: AC
  Filled 2021-08-28: qty 5000

## 2021-08-28 MED ORDER — CELECOXIB 200 MG PO CAPS
200.0000 mg | ORAL_CAPSULE | Freq: Once | ORAL | Status: AC
  Administered 2021-08-28: 200 mg via ORAL
  Filled 2021-08-28: qty 1

## 2021-08-28 MED ORDER — CHLORHEXIDINE GLUCONATE CLOTH 2 % EX PADS
6.0000 | MEDICATED_PAD | Freq: Once | CUTANEOUS | Status: DC
Start: 2021-08-28 — End: 2021-08-28

## 2021-08-28 MED ORDER — MIDAZOLAM HCL 2 MG/2ML IJ SOLN
INTRAMUSCULAR | Status: AC
  Filled 2021-08-28: qty 2

## 2021-08-28 MED ORDER — SODIUM CHLORIDE 0.9% FLUSH: 3.0000 mL | Freq: Two times a day (BID) | INTRAVENOUS | Status: DC

## 2021-08-28 MED ORDER — SODIUM CHLORIDE 0.9% FLUSH: 3.0000 mL | INTRAVENOUS | Status: DC | PRN

## 2021-08-28 MED ORDER — SODIUM CHLORIDE 0.9 % IV SOLN: 250.0000 mL | INTRAVENOUS | Status: DC

## 2021-08-28 MED ORDER — IPRATROPIUM-ALBUTEROL 0.5-2.5 (3) MG/3ML IN SOLN: 3.0000 mL | Freq: Four times a day (QID) | RESPIRATORY_TRACT | Status: DC | PRN

## 2021-08-28 MED ORDER — FENTANYL CITRATE (PF) 100 MCG/2ML IJ SOLN
INTRAMUSCULAR | Status: AC
  Filled 2021-08-28: qty 2

## 2021-08-28 MED ORDER — CHLORHEXIDINE GLUCONATE 0.12 % MT SOLN
15.0000 mL | Freq: Once | OROMUCOSAL | Status: AC
  Administered 2021-08-28: 15 mL via OROMUCOSAL
  Filled 2021-08-28: qty 15

## 2021-08-28 MED ORDER — FENTANYL CITRATE (PF) 100 MCG/2ML IJ SOLN
25.0000 ug | INTRAMUSCULAR | Status: DC | PRN
  Administered 2021-08-28: 50 ug via INTRAVENOUS

## 2021-08-28 MED ORDER — DEXAMETHASONE 4 MG PO TABS
4.0000 mg | ORAL_TABLET | Freq: Four times a day (QID) | ORAL | Status: DC
  Administered 2021-08-28 – 2021-08-30 (×5): 4 mg via ORAL
  Filled 2021-08-28 (×5): qty 1

## 2021-08-28 MED ORDER — ONDANSETRON HCL 4 MG PO TABS: 4.0000 mg | ORAL_TABLET | Freq: Four times a day (QID) | ORAL | Status: DC | PRN

## 2021-08-28 MED ORDER — MIDAZOLAM HCL 2 MG/2ML IJ SOLN
INTRAMUSCULAR | Status: DC | PRN
  Administered 2021-08-28: 2 mg via INTRAVENOUS

## 2021-08-28 MED ORDER — THROMBIN 20000 UNITS EX SOLR
CUTANEOUS | Status: DC | PRN
  Administered 2021-08-28: 20 mL via TOPICAL

## 2021-08-28 MED ORDER — ALBUTEROL SULFATE HFA 108 (90 BASE) MCG/ACT IN AERS: 1.0000 | INHALATION_SPRAY | Freq: Four times a day (QID) | RESPIRATORY_TRACT | Status: DC | PRN

## 2021-08-28 MED ORDER — FLUTICASONE FUROATE-VILANTEROL 100-25 MCG/ACT IN AEPB
1.0000 | INHALATION_SPRAY | Freq: Every day | RESPIRATORY_TRACT | Status: DC
Start: 2021-08-28 — End: 2021-08-30
  Filled 2021-08-28 (×2): qty 28

## 2021-08-28 MED ORDER — GABAPENTIN 300 MG PO CAPS
300.0000 mg | ORAL_CAPSULE | ORAL | Status: AC
  Administered 2021-08-28: 300 mg via ORAL
  Filled 2021-08-28: qty 1

## 2021-08-28 MED ORDER — ALBUTEROL SULFATE (2.5 MG/3ML) 0.083% IN NEBU: 2.5000 mg | INHALATION_SOLUTION | Freq: Four times a day (QID) | RESPIRATORY_TRACT | Status: DC | PRN

## 2021-08-28 MED ORDER — PHENOL 1.4 % MT LIQD: 1.0000 | OROMUCOSAL | Status: DC | PRN

## 2021-08-28 MED ORDER — MENTHOL 3 MG MT LOZG: 1.0000 | LOZENGE | OROMUCOSAL | Status: DC | PRN

## 2021-08-28 MED ORDER — LACTATED RINGERS IV SOLN: INTRAVENOUS | Status: DC

## 2021-08-28 MED ORDER — FUROSEMIDE 40 MG PO TABS
40.0000 mg | ORAL_TABLET | Freq: Every day | ORAL | Status: DC
  Administered 2021-08-29 – 2021-08-30 (×2): 40 mg via ORAL
  Filled 2021-08-28 (×2): qty 1

## 2021-08-28 MED ORDER — THROMBIN 20000 UNITS EX SOLR
CUTANEOUS | Status: AC
  Filled 2021-08-28: qty 20000

## 2021-08-28 MED ORDER — SUFENTANIL CITRATE 50 MCG/ML IV SOLN
INTRAVENOUS | Status: DC | PRN
  Administered 2021-08-28 (×2): 10 ug via INTRAVENOUS

## 2021-08-28 MED ORDER — CEFAZOLIN SODIUM-DEXTROSE 2-4 GM/100ML-% IV SOLN
2.0000 g | INTRAVENOUS | Status: AC
  Administered 2021-08-28: 2 g via INTRAVENOUS
  Filled 2021-08-28: qty 100

## 2021-08-28 MED ORDER — CLONIDINE HCL 0.1 MG PO TABS
0.1000 mg | ORAL_TABLET | Freq: Every day | ORAL | Status: DC
Start: 2021-08-28 — End: 2021-08-30
  Filled 2021-08-28 (×3): qty 1

## 2021-08-28 MED ORDER — MORPHINE SULFATE (PF) 2 MG/ML IV SOLN: 2.0000 mg | INTRAVENOUS | Status: DC | PRN

## 2021-08-28 MED ORDER — TEMAZEPAM 15 MG PO CAPS
30.0000 mg | ORAL_CAPSULE | Freq: Every day | ORAL | Status: DC
  Administered 2021-08-29: 30 mg via ORAL
  Filled 2021-08-28 (×2): qty 2

## 2021-08-28 MED ORDER — DEXAMETHASONE SODIUM PHOSPHATE 4 MG/ML IJ SOLN
4.0000 mg | Freq: Four times a day (QID) | INTRAMUSCULAR | Status: DC
  Administered 2021-08-28 – 2021-08-29 (×2): 4 mg via INTRAVENOUS
  Filled 2021-08-28 (×2): qty 1

## 2021-08-28 MED ORDER — ONDANSETRON HCL 4 MG/2ML IJ SOLN
INTRAMUSCULAR | Status: AC
  Filled 2021-08-28: qty 2

## 2021-08-28 MED ORDER — CEFAZOLIN SODIUM-DEXTROSE 2-4 GM/100ML-% IV SOLN
2.0000 g | Freq: Three times a day (TID) | INTRAVENOUS | Status: AC
  Administered 2021-08-28 – 2021-08-29 (×2): 2 g via INTRAVENOUS
  Filled 2021-08-28 (×2): qty 100

## 2021-08-28 MED ORDER — LIDOCAINE 2% (20 MG/ML) 5 ML SYRINGE
INTRAMUSCULAR | Status: DC | PRN
  Administered 2021-08-28: 60 mg via INTRAVENOUS

## 2021-08-28 MED ORDER — BUPIVACAINE HCL (PF) 0.25 % IJ SOLN
INTRAMUSCULAR | Status: DC | PRN
Start: 2021-08-28 — End: 2021-08-28
  Administered 2021-08-28: 7 mL

## 2021-08-28 MED ORDER — BUPIVACAINE HCL (PF) 0.25 % IJ SOLN
INTRAMUSCULAR | Status: AC
  Filled 2021-08-28: qty 30

## 2021-08-28 MED ORDER — THROMBIN 5000 UNITS EX SOLR
OROMUCOSAL | Status: DC | PRN
  Administered 2021-08-28: 5 mL via TOPICAL

## 2021-08-28 MED ORDER — ORAL CARE MOUTH RINSE: 15.0000 mL | Freq: Once | OROMUCOSAL | Status: AC

## 2021-08-28 MED ORDER — PAROXETINE HCL 20 MG PO TABS
40.0000 mg | ORAL_TABLET | Freq: Every morning | ORAL | Status: DC
  Administered 2021-08-29 – 2021-08-30 (×2): 40 mg via ORAL
  Filled 2021-08-28 (×2): qty 2

## 2021-08-28 MED ORDER — PHENYLEPHRINE 80 MCG/ML (10ML) SYRINGE FOR IV PUSH (FOR BLOOD PRESSURE SUPPORT)
PREFILLED_SYRINGE | INTRAVENOUS | Status: AC
  Filled 2021-08-28: qty 10

## 2021-08-28 MED ORDER — PHENYLEPHRINE HCL-NACL 20-0.9 MG/250ML-% IV SOLN
INTRAVENOUS | Status: DC | PRN
Start: 2021-08-28 — End: 2021-08-28
  Administered 2021-08-28: 50 ug/min via INTRAVENOUS

## 2021-08-28 MED ORDER — OXYCODONE HCL 5 MG PO TABS
5.0000 mg | ORAL_TABLET | ORAL | Status: DC | PRN
  Administered 2021-08-28 – 2021-08-30 (×10): 5 mg via ORAL
  Filled 2021-08-28 (×10): qty 1

## 2021-08-28 MED ORDER — CHLORHEXIDINE GLUCONATE 0.12 % MT SOLN: 15.0000 mL | Freq: Once | OROMUCOSAL | Status: DC

## 2021-08-28 MED ORDER — PAROXETINE HCL 20 MG PO TABS
20.0000 mg | ORAL_TABLET | Freq: Every evening | ORAL | Status: DC
  Administered 2021-08-28 – 2021-08-29 (×2): 20 mg via ORAL
  Filled 2021-08-28 (×3): qty 1

## 2021-08-28 MED ORDER — GLYCOPYRROLATE PF 0.2 MG/ML IJ SOSY
PREFILLED_SYRINGE | INTRAMUSCULAR | Status: AC
  Filled 2021-08-28: qty 1

## 2021-08-28 MED ORDER — NITROFURANTOIN MONOHYD MACRO 100 MG PO CAPS
100.0000 mg | ORAL_CAPSULE | Freq: Every morning | ORAL | Status: DC
  Administered 2021-08-29 – 2021-08-30 (×2): 100 mg via ORAL
  Filled 2021-08-28 (×2): qty 1

## 2021-08-28 MED ORDER — POTASSIUM 75 MG PO TABS
ORAL_TABLET | Freq: Every morning | ORAL | Status: DC
Start: 2021-08-29 — End: 2021-08-28

## 2021-08-28 MED ORDER — FENOFIBRATE 160 MG PO TABS
160.0000 mg | ORAL_TABLET | Freq: Every day | ORAL | Status: DC
Start: 2021-08-28 — End: 2021-08-30
  Administered 2021-08-28 – 2021-08-29 (×2): 160 mg via ORAL
  Filled 2021-08-28 (×2): qty 1

## 2021-08-28 MED ORDER — LACTATED RINGERS IV SOLN: INTRAVENOUS | Status: DC | PRN

## 2021-08-28 MED ORDER — UMECLIDINIUM BROMIDE 62.5 MCG/ACT IN AEPB
1.0000 | INHALATION_SPRAY | Freq: Every day | RESPIRATORY_TRACT | Status: DC
  Filled 2021-08-28 (×2): qty 7

## 2021-08-28 MED ORDER — ROCURONIUM 10MG/ML (10ML) SYRINGE FOR MEDFUSION PUMP - OPTIME
INTRAVENOUS | Status: DC | PRN
  Administered 2021-08-28: 50 mg via INTRAVENOUS
  Administered 2021-08-28: 100 mg via INTRAVENOUS

## 2021-08-28 MED ORDER — ACETAMINOPHEN 500 MG PO TABS
1000.0000 mg | ORAL_TABLET | Freq: Once | ORAL | Status: AC
  Administered 2021-08-28: 1000 mg via ORAL
  Filled 2021-08-28: qty 2

## 2021-08-28 MED ORDER — SENNA 8.6 MG PO TABS
1.0000 | ORAL_TABLET | Freq: Two times a day (BID) | ORAL | Status: DC
  Administered 2021-08-28 – 2021-08-30 (×4): 8.6 mg via ORAL
  Filled 2021-08-28 (×4): qty 1

## 2021-08-28 MED ORDER — PROPOFOL 10 MG/ML IV BOLUS
INTRAVENOUS | Status: AC
  Filled 2021-08-28: qty 20

## 2021-08-28 MED ORDER — FENOFIBRIC ACID 135 MG PO CPDR: 135.0000 mg | DELAYED_RELEASE_CAPSULE | Freq: Every day | ORAL | Status: DC

## 2021-08-28 MED ORDER — PHENYLEPHRINE HCL (PRESSORS) 10 MG/ML IV SOLN
INTRAVENOUS | Status: DC | PRN
  Administered 2021-08-28: 80 ug via INTRAVENOUS
  Administered 2021-08-28: 160 ug via INTRAVENOUS

## 2021-08-28 MED ORDER — METHOCARBAMOL 1000 MG/10ML IJ SOLN: 500.0000 mg | Freq: Four times a day (QID) | INTRAVENOUS | Status: DC | PRN

## 2021-08-28 MED ORDER — ONDANSETRON HCL 4 MG/2ML IJ SOLN: 4.0000 mg | Freq: Four times a day (QID) | INTRAMUSCULAR | Status: DC | PRN

## 2021-08-28 MED ORDER — ONDANSETRON HCL 4 MG/2ML IJ SOLN: 4.0000 mg | Freq: Once | INTRAMUSCULAR | Status: DC | PRN

## 2021-08-28 MED ORDER — OXYCODONE HCL 5 MG PO TABS
5.0000 mg | ORAL_TABLET | Freq: Once | ORAL | Status: AC | PRN
  Administered 2021-08-28: 5 mg via ORAL

## 2021-08-28 MED ORDER — OXYCODONE HCL 5 MG PO TABS
ORAL_TABLET | ORAL | Status: AC
  Filled 2021-08-28: qty 1

## 2021-08-28 MED ORDER — NYSTATIN 100000 UNIT/ML MT SUSP: 10.0000 mL | Freq: Two times a day (BID) | OROMUCOSAL | Status: DC | PRN

## 2021-08-28 MED ORDER — SUFENTANIL CITRATE 50 MCG/ML IV SOLN
INTRAVENOUS | Status: AC
  Filled 2021-08-28: qty 1

## 2021-08-28 MED ORDER — EZETIMIBE 10 MG PO TABS
10.0000 mg | ORAL_TABLET | Freq: Every morning | ORAL | Status: DC
  Administered 2021-08-29 – 2021-08-30 (×2): 10 mg via ORAL
  Filled 2021-08-28 (×2): qty 1

## 2021-08-28 MED ORDER — POTASSIUM CHLORIDE IN NACL 20-0.9 MEQ/L-% IV SOLN: INTRAVENOUS | Status: DC

## 2021-08-28 MED ORDER — OXYCODONE HCL 5 MG/5ML PO SOLN: 5.0000 mg | Freq: Once | ORAL | Status: AC | PRN

## 2021-08-28 MED ORDER — DEXAMETHASONE SODIUM PHOSPHATE 10 MG/ML IJ SOLN
INTRAMUSCULAR | Status: AC
  Filled 2021-08-28: qty 1

## 2021-08-28 MED ORDER — 0.9 % SODIUM CHLORIDE (POUR BTL) OPTIME
TOPICAL | Status: DC | PRN
  Administered 2021-08-28: 1000 mL

## 2021-08-28 SURGICAL SUPPLY — 67 items
BAG COUNTER SPONGE SURGICOUNT (BAG) ×2 IMPLANT
BASKET BONE COLLECTION (BASKET) ×2 IMPLANT
BENZOIN TINCTURE PRP APPL 2/3 (GAUZE/BANDAGES/DRESSINGS) ×2 IMPLANT
BLADE BONE MILL MEDIUM (MISCELLANEOUS) ×1 IMPLANT
BLADE CLIPPER SURG (BLADE) IMPLANT
BUR CARBIDE MATCH 3.0 (BURR) ×2 IMPLANT
CAGE POROUS ATEC 7X9X25 5D (Cage) ×2 IMPLANT
CANISTER SUCT 3000ML PPV (MISCELLANEOUS) ×2 IMPLANT
CLSR STERI-STRIP ANTIMIC 1/2X4 (GAUZE/BANDAGES/DRESSINGS) ×1 IMPLANT
CNTNR URN SCR LID CUP LEK RST (MISCELLANEOUS) ×1 IMPLANT
CONT SPEC 4OZ STRL OR WHT (MISCELLANEOUS) ×1
COVER BACK TABLE 60X90IN (DRAPES) ×2 IMPLANT
DERMABOND ADVANCED (GAUZE/BANDAGES/DRESSINGS) ×1
DERMABOND ADVANCED .7 DNX12 (GAUZE/BANDAGES/DRESSINGS) ×1 IMPLANT
DRAPE C-ARM 42X72 X-RAY (DRAPES) ×3 IMPLANT
DRAPE C-ARMOR (DRAPES) ×1 IMPLANT
DRAPE LAPAROTOMY 100X72X124 (DRAPES) ×2 IMPLANT
DRAPE SURG 17X23 STRL (DRAPES) ×2 IMPLANT
DRSG OPSITE POSTOP 4X6 (GAUZE/BANDAGES/DRESSINGS) ×1 IMPLANT
DURAPREP 26ML APPLICATOR (WOUND CARE) ×2 IMPLANT
ELECT REM PT RETURN 9FT ADLT (ELECTROSURGICAL) ×2
ELECTRODE REM PT RTRN 9FT ADLT (ELECTROSURGICAL) ×1 IMPLANT
EVACUATOR 1/8 PVC DRAIN (DRAIN) ×1 IMPLANT
GAUZE 4X4 16PLY ~~LOC~~+RFID DBL (SPONGE) IMPLANT
GLOVE BIO SURGEON STRL SZ7 (GLOVE) IMPLANT
GLOVE BIO SURGEON STRL SZ8 (GLOVE) ×4 IMPLANT
GLOVE BIOGEL PI IND STRL 7.0 (GLOVE) IMPLANT
GLOVE BIOGEL PI INDICATOR 7.0 (GLOVE) ×3
GOWN STRL REUS W/ TWL LRG LVL3 (GOWN DISPOSABLE) IMPLANT
GOWN STRL REUS W/ TWL XL LVL3 (GOWN DISPOSABLE) ×2 IMPLANT
GOWN STRL REUS W/TWL 2XL LVL3 (GOWN DISPOSABLE) IMPLANT
GOWN STRL REUS W/TWL LRG LVL3 (GOWN DISPOSABLE) ×1
GOWN STRL REUS W/TWL XL LVL3 (GOWN DISPOSABLE) ×3
GRAFT BONE PROTEIOS XL 10CC (Orthopedic Implant) ×1 IMPLANT
HEMOSTAT POWDER KIT SURGIFOAM (HEMOSTASIS) ×1 IMPLANT
KIT BASIN OR (CUSTOM PROCEDURE TRAY) ×2 IMPLANT
KIT GRAFTMAG DEL NEURO DISP (NEUROSURGERY SUPPLIES) IMPLANT
KIT TURNOVER KIT B (KITS) ×2 IMPLANT
MATRIX STRIP NEOCORE 12C (Putty) IMPLANT
MILL BONE PREP (MISCELLANEOUS) ×1 IMPLANT
NDL HYPO 25X1 1.5 SAFETY (NEEDLE) ×1 IMPLANT
NEEDLE HYPO 25X1 1.5 SAFETY (NEEDLE) ×2 IMPLANT
NS IRRIG 1000ML POUR BTL (IV SOLUTION) ×2 IMPLANT
PACK LAMINECTOMY NEURO (CUSTOM PROCEDURE TRAY) ×2 IMPLANT
PAD ARMBOARD 7.5X6 YLW CONV (MISCELLANEOUS) ×6 IMPLANT
ROD LORD LIPPED TI 5.5X60 (Rod) ×2 IMPLANT
SCREW ILIAC PA 5.5X40 (Screw) ×2 IMPLANT
SCREW KODIAK 6.5X40 (Screw) ×2 IMPLANT
SCREW POLYAXIAL TULIP (Screw) ×2 IMPLANT
SCREW SHANK MOD 5.5X40 (Screw) ×2 IMPLANT
SET SCREW (Screw) ×6 IMPLANT
SET SCREW SPNE (Screw) IMPLANT
SPACER IDENTITI PS 10X9X25 10D (Spacer) ×2 IMPLANT
SPONGE SURGIFOAM ABS GEL 100 (HEMOSTASIS) ×2 IMPLANT
SPONGE T-LAP 4X18 ~~LOC~~+RFID (SPONGE) IMPLANT
STRIP CLOSURE SKIN 1/2X4 (GAUZE/BANDAGES/DRESSINGS) ×4 IMPLANT
STRIP MATRIX NEOCORE 12CC (Putty) ×1 IMPLANT
SUT VIC AB 0 CT1 18XCR BRD8 (SUTURE) ×1 IMPLANT
SUT VIC AB 0 CT1 8-18 (SUTURE) ×1
SUT VIC AB 2-0 CP2 18 (SUTURE) ×2 IMPLANT
SUT VIC AB 3-0 SH 8-18 (SUTURE) ×4 IMPLANT
SYR CONTROL 10ML LL (SYRINGE) ×1 IMPLANT
TOWEL GREEN STERILE (TOWEL DISPOSABLE) ×2 IMPLANT
TOWEL GREEN STERILE FF (TOWEL DISPOSABLE) ×2 IMPLANT
TRAY FOLEY MTR SLVR 14FR STAT (SET/KITS/TRAYS/PACK) ×1 IMPLANT
TRAY FOLEY MTR SLVR 16FR STAT (SET/KITS/TRAYS/PACK) ×1 IMPLANT
WATER STERILE IRR 1000ML POUR (IV SOLUTION) ×2 IMPLANT

## 2021-08-28 NOTE — Transfer of Care (Signed)
Immediate Anesthesia Transfer of Care Note ? ?Patient: Andrea Houston ? ?Procedure(s) Performed: Posterior Lumbar Interbody Fusion  - Lumbar four-Lumbar five - Lumbar five-Sacral one - Posterior Lateral and Interbody fusion (Back) ? ?Patient Location: PACU ? ?Anesthesia Type:General ? ?Level of Consciousness: awake, drowsy, patient cooperative and responds to stimulation ? ?Airway & Oxygen Therapy: Patient Spontanous Breathing and Patient connected to nasal cannula oxygen ? ?Post-op Assessment: Report given to RN, Post -op Vital signs reviewed and stable and Patient moving all extremities X 4 ? ?Post vital signs: Reviewed and stable ? ?Last Vitals:  ?Vitals Value Taken Time  ?BP 156/90 08/28/21 1459  ?Temp    ?Pulse 88 08/28/21 1503  ?Resp 10 08/28/21 1503  ?SpO2 100 % 08/28/21 1503  ?Vitals shown include unvalidated device data. ? ?Last Pain:  ?Vitals:  ? 08/28/21 0910  ?TempSrc: Oral  ?   ? ?  ? ?Complications: No notable events documented. ?

## 2021-08-28 NOTE — Op Note (Signed)
08/28/2021 ? ?2:55 PM ? ?PATIENT:  Andrea Houston  66 y.o. female ? ?PRE-OPERATIVE DIAGNOSIS: Degenerative spondylolisthesis L4-5 with spinal stenosis, degenerative disc disease L5-S1 with calcified disc herniation with spinal stenosis, back pain with right more than left leg pain ? ?POST-OPERATIVE DIAGNOSIS:  same ? ?PROCEDURE:   ?1. Decompressive lumbar laminectomy, hemi facetectomy and foraminotomies L4-5 L5-S1 requiring more work than would be required for a simple exposure of the disk for PLIF in order to adequately decompress the neural elements and address the spinal stenosis ?2. Posterior lumbar interbody fusion L4-5 L5-S1 using porous titanium interbody cages packed with morcellized allograft and autograft  ?3. Posterior fixation L4-S1 inclusive using Alphatec cortical pedicle screws.  ?4. Intertransverse arthrodesis L4-S1 using morcellized autograft and allograft. ? ?SURGEON:  Sherley Bounds, MD ? ?ASSISTANTS: Glenford Peers FNP ? ?ANESTHESIA:  General ? ?EBL: 250 ml ? ?Total I/O ?In: 1000 [I.V.:1000] ?Out: 370 [Urine:120; Blood:250] ? ?BLOOD ADMINISTERED:none ? ?DRAINS: none  ? ?INDICATION FOR PROCEDURE: This patient presented with back pain with severe leg pain. Imaging revealed spondylolisthesis with stenosis at L4-5 and degenerative disc disease with lumbar disc herniation and spinal stenosis at L5-S1. The patient tried a reasonable attempt at conservative medical measures without relief. I recommended decompression and instrumented fusion to address the stenosis as well as the segmental  instability.  Patient understood the risks, benefits, and alternatives and potential outcomes and wished to proceed. ? ?PROCEDURE DETAILS:  ?The patient was brought to the operating room. After induction of generalized endotracheal anesthesia the patient was rolled into the prone position on chest rolls and all pressure points were padded. The patient's lumbar region was cleaned and then prepped with DuraPrep and  draped in the usual sterile fashion. Anesthesia was injected and then a dorsal midline incision was made and carried down to the lumbosacral fascia. The fascia was opened and the paraspinous musculature was taken down in a subperiosteal fashion to expose L4-5 and L5-S1. A self-retaining retractor was placed. Intraoperative fluoroscopy confirmed my level, and I started with placement of the L4 cortical pedicle screws. The pedicle screw entry zones were identified utilizing surface landmarks and  AP and lateral fluoroscopy. I scored the cortex with the high-speed drill and then used the hand drill to drill an upward and outward direction into the pedicle. I then tapped line to line. I then placed a 5.5 x 40 mm cortical pedicle screw into the pedicles of L4 bilaterally.  ? ? I then turned my attention to the decompression and complete lumbar laminectomies, hemi- facetectomies, and foraminotomies were performed at L4-5 and L5-S1.  My nurse practitioner was directly involved in the decompression and exposure of the neural elements. the patient had significant spinal stenosis and this required more work than would be required for a simple exposure of the disc for posterior lumbar interbody fusion which would only require a limited laminotomy. Much more generous decompression and generous foraminotomy was undertaken in order to adequately decompress the neural elements and address the patient's leg pain. The yellow ligament was removed to expose the underlying dura and nerve roots, and generous foraminotomies were performed to adequately decompress the neural elements. Both the exiting and traversing nerve roots were decompressed on both sides until a coronary dilator passed easily along the nerve roots. Once the decompression was complete, I turned my attention to the posterior lower lumbar interbody fusion. The epidural venous vasculature was coagulated and cut sharply. Disc space was incised and the initial discectomy  was performed  with pituitary rongeurs. The disc space was distracted with sequential distractors to a height of 10 mm at L4-5 and 8 mm at L5-S1. We then used a series of scrapers and shavers to prepare the endplates for fusion. The midline was prepared with Epstein curettes. Once the complete discectomy was finished, we packed an appropriate sized interbody cage with local autograft and morcellized allograft, gently retracted the nerve root, and tapped the cage into position at L4-5 and L5-S1.  The midline between the cages was packed with morselized autograft and allograft.  ? ?We then turned our attention to the placement of the lower pedicle screws. The pedicle screw entry zones were identified utilizing surface landmarks and fluoroscopy. I drilled into each pedicle utilizing the hand drill, and tapped each pedicle with the appropriate tap. We palpated with a ball probe to assure no break in the cortex. We then placed 5.5 x 40 mm pedicle screws into the pedicles bilaterally at L5 and 6.5 x 40 mm pedicle screws into S1 bilaterally.  My nurse practitioner assisted in placement of the pedicle screws.  We then decorticated the transverse processes and laid a mixture of morcellized autograft and allograft out over these to perform intertransverse arthrodesis at L4-S1. We then placed lordotic rods into the multiaxial screw heads of the pedicle screws and locked these in position with the locking caps and anti-torque device. We then checked our construct with AP and lateral fluoroscopy. Irrigated with copious amounts of saline solution. Inspected the nerve roots once again to assure adequate decompression, lined to the dura with Gelfoam,  and then we closed the muscle and the fascia with 0 Vicryl. Closed the subcutaneous tissues with 2-0 Vicryl and subcuticular tissues with 3-0 Vicryl. The skin was closed with benzoin and Steri-Strips. Dressing was then applied, the patient was awakened from general anesthesia and  transported to the recovery room in stable condition. At the end of the procedure all sponge, needle and instrument counts were correct.  ? ?PLAN OF CARE: admit to inpatient ? ?PATIENT DISPOSITION:  PACU - hemodynamically stable. ?  ?Delay start of Pharmacological VTE agent (>24hrs) due to surgical blood loss or risk of bleeding:  yes ? ? ?

## 2021-08-28 NOTE — Anesthesia Postprocedure Evaluation (Signed)
Anesthesia Post Note ? ?Patient: Andrea Houston ? ?Procedure(s) Performed: Posterior Lumbar Interbody Fusion  - Lumbar four-Lumbar five - Lumbar five-Sacral one - Posterior Lateral and Interbody fusion (Back) ? ?  ? ?Patient location during evaluation: PACU ?Anesthesia Type: General ?Level of consciousness: awake and alert ?Pain management: pain level controlled ?Vital Signs Assessment: post-procedure vital signs reviewed and stable ?Respiratory status: spontaneous breathing, nonlabored ventilation and respiratory function stable ?Cardiovascular status: stable and blood pressure returned to baseline ?Anesthetic complications: no ? ? ?No notable events documented. ? ?Last Vitals:  ?Vitals:  ? 08/28/21 1530 08/28/21 1545  ?BP: 133/79   ?Pulse: 80 83  ?Resp: 12 16  ?Temp:    ?SpO2: 96% 96%  ?  ?Last Pain:  ?Vitals:  ? 08/28/21 1545  ?TempSrc:   ?PainSc: Asleep  ? ? ?  ?  ?  ?  ?  ?  ? ?Beryle Lathe ? ? ? ? ?

## 2021-08-28 NOTE — Anesthesia Procedure Notes (Signed)
Procedure Name: Intubation ?Date/Time: 08/28/2021 11:21 AM ?Performed by: Claris Che, CRNA ?Pre-anesthesia Checklist: Patient identified, Emergency Drugs available, Suction available, Patient being monitored and Timeout performed ?Patient Re-evaluated:Patient Re-evaluated prior to induction ?Oxygen Delivery Method: Circle system utilized ?Preoxygenation: Pre-oxygenation with 100% oxygen ?Induction Type: IV induction and Cricoid Pressure applied ?Ventilation: Mask ventilation without difficulty ?Laryngoscope Size: Mac and 4 ?Grade View: Grade II ?Tube type: Oral ?Tube size: 7.5 mm ?Number of attempts: 1 ?Airway Equipment and Method: Stylet ?Placement Confirmation: ETT inserted through vocal cords under direct vision, positive ETCO2 and breath sounds checked- equal and bilateral ?Secured at: 22 cm ?Tube secured with: Tape ?Dental Injury: Teeth and Oropharynx as per pre-operative assessment  ? ? ? ? ?

## 2021-08-28 NOTE — H&P (Signed)
Subjective: ?Patient is a 66 y.o. female admitted for back and leg pain. Onset of symptoms was several months ago, gradually worsening since that time.  The pain is rated severe, and is located at the across the lower back and radiates to legs. The pain is described as aching and occurs all day. The symptoms have been progressive. Symptoms are exacerbated by exercise, standing, and walking for more than a few minutes. MRI or CT showed spondylolisthesis with stenosis L4-5 and degenerative disc disease with stenosis L5-S1.  She also has right hip disease. ? ?Past Medical History:  ?Diagnosis Date  ? Anxiety   ? CAD (coronary artery disease)   ? 3 stents to mid RCA 2012 by Dr. Rockne Menghini in [place], [place]  ? COPD (chronic obstructive pulmonary disease) (HCC)   ? Renal insufficiency   ? SBO (small bowel obstruction) (HCC) 03/08/2016  ? Sleep apnea   ? Uric acid nephrolithiasis 04/24/2016  ? Urinary tract infection 04/24/2016  ?  ?Past Surgical History:  ?Procedure Laterality Date  ? APPENDECTOMY    ? CHOLECYSTECTOMY    ? CORONARY ANGIOPLASTY  09/2010  ? with stent placement  ? PR cysto/uretero w/lithotripsy    ? indwelling stent insertion, stents have since been removed  ?  ?Prior to Admission medications   ?Medication Sig Start Date End Date Taking? Authorizing Provider  ?Cholecalciferol (VITAMIN D3 PO) Take 1 tablet by mouth every evening.   Yes [provider]  ?Choline Fenofibrate (FENOFIBRIC ACID) 135 MG CPDR Take 135 mg by mouth at bedtime. 05/30/21  Yes [provider]  ?cloNIDine (CATAPRES) 0.1 MG tablet Take 0.1 mg by mouth at bedtime. 08/14/21  Yes [provider]  ?cyclobenzaprine (FLEXERIL) 5 MG tablet Take 5 mg by mouth 2 (two) times daily. 07/22/21  Yes [provider]  ?ezetimibe (ZETIA) 10 MG tablet Take 10 mg by mouth in the morning.   Yes [provider]  ?furosemide (LASIX) 40 MG tablet Take 40 mg by mouth See admin instructions. Take 1 tablet (40 mg) by mouth  scheduled every morning & may take an additional dose in the afternoon if still swollen. 06/29/21  Yes [provider]  ?ipratropium-albuterol (DUONEB) 0.5-2.5 (3) MG/3ML SOLN Take 3 mLs by nebulization every 6 (six) hours as needed (wheezing/shortness of breath).   Yes [provider]  ?[place]idence Lanius 500 MG CAPS Take 500 mg by mouth in the morning.   Yes [provider]  ?LORazepam (ATIVAN) 0.5 MG tablet Take 0.5 mg by mouth 3 (three) times daily as needed for anxiety. 08/14/21  Yes [provider]  ?methocarbamol (ROBAXIN) 500 MG tablet Take 1 tablet (500 mg total) by mouth 2 (two) times daily. 04/10/21  Yes Karie Mainland, Amjad, PA-C  ?metoprolol succinate (TOPROL-XL) 50 MG 24 hr tablet Take 50 mg by mouth in the morning. 06/28/21  Yes [provider]  ?nitrofurantoin, macrocrystal-monohydrate, (MACROBID) 100 MG capsule Take 100 mg by mouth in the morning. 08/15/21  Yes [provider]  ?nystatin (MYCOSTATIN) 100000 UNIT/ML suspension Take 10-20 mLs by mouth 2 (two) times daily as needed (oral thrush). 05/16/21  Yes [provider]  ?ondansetron (ZOFRAN) 4 MG tablet Take 4 mg by mouth every 8 (eight) hours as needed for nausea. 07/06/21  Yes [provider]  ?oxyCODONE-acetaminophen (PERCOCET/ROXICET) 5-325 MG tablet Take 0.5-1 tablets by mouth every 6 (six) hours as needed for severe pain.   Yes [provider]  ?pantoprazole (PROTONIX) 40 MG tablet Take 40 mg by  mouth in the morning. 06/12/21  Yes [provider]  ?PARoxetine (PAXIL) 20 MG tablet Take 20 mg by mouth every evening. 08/21/21  Yes [provider]  ?PARoxetine (PAXIL) 40 MG tablet Take 40 mg by mouth in the morning. 08/14/21  Yes [provider]  ?POTASSIUM PO Take 1 tablet by mouth in the morning.   Yes [provider]  ?promethazine-dextromethorphan (PROMETHAZINE-DM) 6.25-15 MG/5ML syrup Take 5 mLs by mouth every 4 (four) hours as needed. 05/26/21  Yes  [provider]  ?rosuvastatin (CRESTOR) 40 MG tablet Take 40 mg by mouth at bedtime. 05/30/21  Yes [provider]  ?temazepam (RESTORIL) 30 MG capsule Take 30 mg by mouth at bedtime. 05/29/21  Yes [provider]  ?Dwyane Luo 100-62.5-25 MCG/ACT AEPB Inhale 1 puff into the lungs every other day. 02/28/21  Yes [provider]  ?VENTOLIN HFA 108 (90 Base) MCG/ACT inhaler Inhale 1-2 puffs into the lungs every 6 (six) hours as needed for wheezing or shortness of breath. 02/28/21  Yes [provider]  ?clopidogrel (PLAVIX) 75 MG tablet Take 75 mg by mouth daily. 05/18/21   [provider]  ?HYDROcodone-acetaminophen (NORCO/VICODIN) 5-325 MG tablet Take 1 tablet by mouth 2 (two) times daily as needed. 03/28/21   [provider]  ? ?Allergies  ?Allergen Reactions  ? Codeine Nausea And Vomiting and Nausea Only  ?  Patient tolerates with a anti-nausea medication.  ?  ?Social History  ? ?Tobacco Use  ? Smoking status: Former  ?  Types: Cigarettes  ?  Quit date: 07/2021  ?  Years since quitting: 0.1  ? Smokeless tobacco: Never  ?Substance Use Topics  ? Alcohol use: Not Currently  ?  ?History reviewed. No pertinent family history. ?  ?Review of Systems ? ?Positive ROS: Negative ? ?All other systems have been reviewed and were otherwise negative with the exception of those mentioned in the HPI and as above. ? ?Objective: ?Vital signs in last 24 hours: ?Temp:  [97.6 ?F (36.4 ?C)] 97.6 ?F (36.4 ?C) (05/15 0910) ?Pulse Rate:  [72] 72 (05/15 0910) ?Resp:  [17] 17 (05/15 0910) ?BP: (133)/(79) 133/79 (05/15 0910) ?SpO2:  [98 %] 98 % (05/15 0910) ?Weight:  [68.5 kg] 68.5 kg (05/15 0910) ? ?General Appearance: Alert, cooperative, no distress, appears stated age ?Head: Normocephalic, without obvious abnormality, atraumatic ?Eyes: PERRL, conjunctiva/corneas clear, EOM's intact    ?Neck: Supple, symmetrical, trachea midline ?Back: Symmetric, no curvature, ROM normal, no CVA  tenderness ?Lungs:  respirations unlabored ?Heart: Regular rate and rhythm ?Abdomen: Soft, non-tender ?Extremities: Extremities normal, atraumatic, no cyanosis or edema ?Pulses: 2+ and symmetric all extremities ?Skin: Skin color, texture, turgor normal, no rashes or lesions ? ?NEUROLOGIC:  ? ?Mental status: Alert and oriented x4,  no aphasia, good attention span, fund of knowledge, and memory ?Motor Exam - grossly normal ?Sensory Exam - grossly normal ?Reflexes: 1 Plus ?Coordination - grossly normal ?Gait - grossly normal ?Balance - grossly normal ?Cranial Nerves: ?I: smell Not tested  ?II: visual acuity  OS: nl    OD: nl  ?II: visual fields Full to confrontation  ?II: pupils Equal, round, reactive to light  ?III,VII: ptosis None  ?III,IV,VI: extraocular muscles  Full ROM  ?V: mastication Normal  ?V: facial light touch sensation  Normal  ?V,VII: corneal reflex  Present  ?VII: facial muscle function - upper  Normal  ?VII: facial muscle function - lower Normal  ?VIII: hearing Not tested  ?IX: soft palate elevation  Normal  ?IX,X: gag reflex Present  ?XI: trapezius strength  5/5  ?XI: sternocleidomastoid strength 5/5  ?XI: neck flexion strength  5/5  ?XII: tongue strength  Normal  ? ? ?Data Review ?Lab Results  ?Component Value Date  ? WBC 8.7 08/25/2021  ? HGB 13.6 08/25/2021  ? HCT 43.4 08/25/2021  ? MCV 88.4 08/25/2021  ? PLT 258 08/25/2021  ? ?Lab Results  ?Component Value Date  ? NA 141 08/25/2021  ? K 3.9 08/25/2021  ? CL 104 08/25/2021  ? CO2 28 08/25/2021  ? BUN 17 08/25/2021  ? CREATININE 1.17 (H) 08/25/2021  ? GLUCOSE 105 (H) 08/25/2021  ? ?Lab Results  ?Component Value Date  ? INR 1.1 08/25/2021  ? ? ?Assessment/Plan: ? ?Estimated body mass index is 28.53 kg/m? as calculated from the following: ?  Height as of this encounter: 5\' 1"  (1.549 m). ?  Weight as of this encounter: 68.5 kg. ?Patient admitted for PLIF L4=5 L5-S1. Patient has failed a reasonable attempt at conservative therapy. ? ?I explained the  condition and procedure to the patient and answered any questions.  Patient wishes to proceed with procedure as planned. Understands risks/ benefits and typical outcomes of procedure. ? ? ?Tia Alertavid S Landa Mullinax ?08/28/2021 10

## 2021-08-29 NOTE — Progress Notes (Signed)
Pt refused Breo and Incruse DPI's this morning due to them causing thrush in her mouth in the past. Pt says she rinsed several times after administration but still kept developing it. RN made aware. ?

## 2021-08-29 NOTE — Evaluation (Signed)
Physical Therapy Evaluation  ? ?Patient Details ?Name: Andrea Houston ?MRN: [id number] ?DOB: [birth year] ?Today's Date: 08/29/2021 ? ?History of Present Illness ? Pt is a 66 y/o F who presents s/p decompressive lumbar laminectomy, hemi facetectomy,and foraminectomies L4-S1 on 08/28/2021. PMH significant for CAD, COPD, and anxiety. ?  ?Clinical Impression ? Pt admitted with above diagnosis. At the time of PT eval, pt was able to demonstrate transfers and ambulation with gross min guard assist and RW for support. Pt was educated on precautions, brace application/wearing schedule, appropriate activity progression, and car transfer. Pt currently with functional limitations due to the deficits listed below (see PT Problem List). Pt will benefit from skilled PT to increase their independence and safety with mobility to allow discharge to the venue listed below.     ?   ? ?Recommendations for follow up therapy are one component of a multi-disciplinary discharge planning process, led by the attending physician.  Recommendations may be updated based on patient status, additional functional criteria and insurance authorization. ? ?Follow Up Recommendations Home health PT ? ?  ?Assistance Recommended at Discharge Frequent or constant Supervision/Assistance  ?Patient can return home with the following ? A little help with walking and/or transfers;A little help with bathing/dressing/bathroom;Assistance with cooking/housework;Assist for transportation;Help with stairs or ramp for entrance ? ?  ?Equipment Recommendations Rolling walker (2 wheels);BSC/3in1  ?Recommendations for Other Services ?    ?  ?Functional Status Assessment Patient has had a recent decline in their functional status and demonstrates the ability to make significant improvements in function in a reasonable and predictable amount of time.  ? ?  ?Precautions / Restrictions Precautions ?Precautions: Back ?Precaution Booklet Issued: Yes (comment) ?Precaution Comments:  Reviewed handout and pt was cued for precautions during functional mobility. ?Required Braces or Orthoses: Spinal Brace ?Spinal Brace: Lumbar corset;Applied in sitting position ?Restrictions ?Weight Bearing Restrictions: No  ? ?  ? ?Mobility ? Bed Mobility ?Overal bed mobility: Needs Assistance ?Bed Mobility: Sit to Sidelying, Rolling ?Rolling: Modified independent (Device/Increase time) ?  ?  ?  ?Sit to sidelying: Supervision ?General bed mobility comments: VC's for sequencing and optimal log roll technique. No assist required. ?  ? ?Transfers ?Overall transfer level: Needs assistance ?Equipment used: Rolling walker (2 wheels) ?Transfers: Sit to/from Stand ?Sit to Stand: Min guard ?  ?  ?  ?  ?  ?General transfer comment: VC's for hand placement on seated surface for safety, as well as to keep RW close as she backs up to bed to prepare for stand>sit. Close guard for safety but no assist required. ?  ? ?Ambulation/Gait ?Ambulation/Gait assistance: Min guard ?Gait Distance (Feet): 200 Feet ?Assistive device: Rolling walker (2 wheels) ?Gait Pattern/deviations: Step-through pattern, Decreased stride length, Trunk flexed, Knee flexed in stance - right, Knee flexed in stance - left ?Gait velocity: Decreased ?Gait velocity interpretation: <1.31 ft/sec, indicative of household ambulator ?  ?General Gait Details: Slow and guarded due to pain. Gait deviations and speed consistent for 75% of gait training, however last 25% of gait training pt complaining of knees feeling like they were "giving out" and gasping for breath. Pt rushing to get back to the room. This occured immediately after RN told pt she was doing well, and pt reports she does not feel like shes doing well and cannot go home today. Upon return to room, pt was able to return to a normal breathing pattern almost immediately with seated rest break, but she did want to sit EOB for a few  minutes before she felt like she could lay back down due to pain. ? ?Stairs ?   ?  ?  ?  ?  ? ?Wheelchair Mobility ?  ? ?Modified Rankin (Stroke Patients Only) ?  ? ?  ? ?Balance Overall balance assessment: Mild deficits observed, not formally tested ?  ?  ?  ?  ?  ?  ?  ?  ?  ?  ?  ?  ?  ?  ?  ?  ?  ?  ?   ? ? ? ?Pertinent Vitals/Pain Pain Assessment ?Pain Assessment: Faces ?Faces Pain Scale: Hurts whole lot ?Pain Location: Back ?Pain Descriptors / Indicators: Operative site guarding, Sore, Aching ?Pain Intervention(s): Limited activity within patient's tolerance, Monitored during session, Repositioned, Ice applied  ? ? ?Home Living Family/patient expects to be discharged to:: Private residence ?Living Arrangements: Spouse/significant other ?Available Help at Discharge: Family;Available PRN/intermittently ?Type of Home: House ?Home Access: Stairs to enter ?  ?Entrance Stairs-Number of Steps: 3 ?  ?Home Layout: One level ?Home Equipment: None ?   ?  ?Prior Function Prior Level of Function : Needs assist ?  ?  ?  ?Physical Assist : ADLs (physical);Mobility (physical) ?Mobility (physical): Gait;Transfers ?ADLs (physical): Dressing;Bathing;IADLs ?Mobility Comments: spouse provides handheld assist during mobility ?ADLs Comments: spouse was assisting with LB dressing and bathing ?  ? ? ?Hand Dominance  ?   ? ?  ?Extremity/Trunk Assessment  ? Upper Extremity Assessment ?Upper Extremity Assessment: Defer to OT evaluation ?  ? ?Lower Extremity Assessment ?Lower Extremity Assessment: Generalized weakness (Consistent with pre-op diagnosis) ?  ? ?Cervical / Trunk Assessment ?Cervical / Trunk Assessment: Back Surgery  ?Communication  ? Communication: No difficulties  ?Cognition Arousal/Alertness: Awake/alert ?Behavior During Therapy: [facility] for tasks assessed/performed ?Overall Cognitive Status: Within Functional Limits for tasks assessed ?  ?  ?  ?  ?  ?  ?  ?  ?  ?  ?  ?  ?  ?  ?  ?  ?  ?  ?  ? ?  ?General Comments General comments (skin integrity, edema, etc.): VSS on RA ? ?  ?Exercises     ? ?Assessment/Plan  ?  ?PT Assessment Patient needs continued PT services  ?PT Problem List Decreased strength;Decreased activity tolerance;Decreased balance;Decreased mobility;Decreased knowledge of use of DME;Decreased safety awareness;Decreased knowledge of precautions;Pain ? ?   ?  ?PT Treatment Interventions DME instruction;Gait training;Functional mobility training;Therapeutic activities;Therapeutic exercise;Neuromuscular re-education;Patient/family education   ? ?PT Goals (Current goals can be found in the Care Plan section)  ?Acute Rehab PT Goals ?Patient Stated Goal: Return home with family support ?PT Goal Formulation: With patient ?Time For Goal Achievement: 09/05/21 ?Potential to Achieve Goals: Good ? ?  ?Frequency Min 5X/week ?  ? ? ?Co-evaluation   ?  ?  ?  ?  ? ? ?  ?AM-PAC PT "6 Clicks" Mobility  ?Outcome Measure Help needed turning from your back to your side while in a flat bed without using bedrails?: A Little ?Help needed moving from lying on your back to sitting on the side of a flat bed without using bedrails?: A Little ?Help needed moving to and from a bed to a chair (including a wheelchair)?: A Little ?Help needed standing up from a chair using your arms (e.g., wheelchair or bedside chair)?: A Little ?Help needed to walk in hospital room?: A Little ?Help needed climbing 3-5 steps with a railing? : A Little ?6 Click Score: 18 ? ?  ?  End of Session Equipment Utilized During Treatment: Gait belt;Back brace ?Activity Tolerance: Patient tolerated treatment well ?Patient left: in bed;with call bell/phone within reach ?Nurse Communication: Mobility status ?PT Visit Diagnosis: Unsteadiness on feet (R26.81);Pain ?Pain - part of body:  (back) ?  ? ?Time: [id number] ?PT Time Calculation (min) (ACUTE ONLY): 18 min ? ? ?Charges:   PT Evaluation ?$PT Eval Low Complexity: 1 Low ?  ?  ?   ? ? ?Conni SlipperLaura Nazaret Chea, PT, DPT ?Acute Rehabilitation Services ?Secure Chat Preferred ?Office: [phone]  ? ?Marylynn PearsonLaura D  Dirck Butch ?08/29/2021, 10:04 AM ? ?

## 2021-08-29 NOTE — Evaluation (Signed)
Occupational Therapy Evaluation ?Patient Details ?Name: Andrea BussingVickie O Haig ?MRN: [id number] ?DOB: 1955/08/18 ?Today's Date: 08/29/2021 ? ? ?History of Present Illness Pt is a 66 y/o F s/p decompressive lumbar laminectomy, hemi facetectomy,and foraminectomies L4-5, L5-S1. PMH includes CAD, COPD, and anxiety.  ? ?Clinical Impression ?  ?Pt requiring assistance at baseline with ADLs and functional mobility. Pt reports spouse provides handheld assist during mobility, and assists with LB dressing, bathing, and IADLs at home, however spouse will not be home all the time at d/c. Pt currently requiring set up -mod A for ADLs,  min A for bed mobility, and min guard for transfers with RW. Pt educated on back precautions, brace wear schedule,along with compensatory strategies for UB/LB dressing, pt verbalized understanding and adheres to precautions throughout session. Pt presenting with impairments listed below, will follow acutely. Recommend HHOT at d/c. ?   ? ?Recommendations for follow up therapy are one component of a multi-disciplinary discharge planning process, led by the attending physician.  Recommendations may be updated based on patient status, additional functional criteria and insurance authorization.  ? ?Follow Up Recommendations ? Home health OT  ?  ?Assistance Recommended at Discharge Intermittent Supervision/Assistance  ?Patient can return home with the following A little help with walking and/or transfers;A little help with bathing/dressing/bathroom;Assistance with cooking/housework;Assist for transportation;Help with stairs or ramp for entrance ? ?  ?Functional Status Assessment ? Patient has had a recent decline in their functional status and demonstrates the ability to make significant improvements in function in a reasonable and predictable amount of time.  ?Equipment Recommendations ? BSC/3in1  ?  ?Recommendations for Other Services PT consult ? ? ?  ?Precautions / Restrictions Precautions ?Precautions:  Back ?Precaution Booklet Issued: Yes (comment) ?Precaution Comments: educated on 3/3 back precautions ?Required Braces or Orthoses: Spinal Brace ?Spinal Brace: Lumbar corset;Applied in sitting position ?Restrictions ?Weight Bearing Restrictions: No  ? ?  ? ?Mobility Bed Mobility ?Overal bed mobility: Needs Assistance ?Bed Mobility: Sidelying to Sit ?  ?Sidelying to sit: Min assist ?  ?  ?  ?General bed mobility comments: min A for trunk elevation ?  ? ?Transfers ?Overall transfer level: Needs assistance ?Equipment used: Rolling walker (2 wheels) ?Transfers: Sit to/from Stand ?Sit to Stand: Min guard ?  ?  ?  ?  ?  ?General transfer comment: increased time due to pain ?  ? ?  ?Balance Overall balance assessment: Mild deficits observed, not formally tested ?  ?  ?  ?  ?  ?  ?  ?  ?  ?  ?  ?  ?  ?  ?  ?  ?  ?  ?   ? ?ADL either performed or assessed with clinical judgement  ? ?ADL Overall ADL's : Needs assistance/impaired ?Eating/Feeding: Set up;Sitting ?  ?Grooming: Set up;Sitting ?  ?Upper Body Bathing: Sitting;Min guard ?  ?Lower Body Bathing: Moderate assistance;Sitting/lateral leans ?  ?Upper Body Dressing : Sitting;Minimal assistance ?Upper Body Dressing Details (indicate cue type and reason): to don shirt and brace ?Lower Body Dressing: Moderate assistance;Sitting/lateral leans ?Lower Body Dressing Details (indicate cue type and reason): to don pants ?Toilet Transfer: Min guard;Rolling walker (2 wheels);Regular Toilet;Ambulation ?Toilet Transfer Details (indicate cue type and reason): + use of grab bars ?Toileting- Clothing Manipulation and Hygiene: Supervision/safety;Sitting/lateral lean ?  ?  ?  ?Functional mobility during ADLs: Min guard;Rolling walker (2 wheels) ?   ? ? ? ?Vision   ?Vision Assessment?: No apparent visual deficits  ?   ?Perception   ?  ?  Praxis   ?  ? ?Pertinent Vitals/Pain Pain Assessment ?Pain Assessment: No/denies pain  ? ? ? ?Hand Dominance   ?  ?Extremity/Trunk Assessment Upper Extremity  Assessment ?Upper Extremity Assessment: Generalized weakness ?  ?Lower Extremity Assessment ?Lower Extremity Assessment: Defer to PT evaluation ?  ?Cervical / Trunk Assessment ?Cervical / Trunk Assessment: Back Surgery ?  ?Communication Communication ?Communication: No difficulties ?  ?Cognition Arousal/Alertness: Awake/alert ?Behavior During Therapy: [facility] for tasks assessed/performed ?Overall Cognitive Status: Within Functional Limits for tasks assessed ?  ?  ?  ?  ?  ?  ?  ?  ?  ?  ?  ?  ?  ?  ?  ?  ?  ?  ?  ?General Comments  VSS on RA ? ?  ?Exercises   ?  ?Shoulder Instructions    ? ? ?Home Living Family/patient expects to be discharged to:: Private residence ?Living Arrangements: Spouse/significant other ?Available Help at Discharge: Family;Available PRN/intermittently ?Type of Home: House ?Home Access: Stairs to enter ?Entrance Stairs-Number of Steps: 3 ?  ?Home Layout: One level ?  ?  ?Bathroom Shower/Tub: Tub/shower unit;Curtain ?  ?  ?  ?  ?Home Equipment: None ?  ?  ?  ? ?  ?Prior Functioning/Environment Prior Level of Function : Needs assist ?  ?  ?  ?Physical Assist : ADLs (physical);Mobility (physical) ?Mobility (physical): Gait;Transfers ?ADLs (physical): Dressing;Bathing;IADLs ?Mobility Comments: spouse provides handheld assist during mobility ?ADLs Comments: spouse was assisting with LB dressing and bathing ?  ? ?  ?  ?OT Problem List: Decreased strength;Decreased range of motion;Impaired balance (sitting and/or standing);Decreased activity tolerance;Decreased knowledge of use of DME or AE ?  ?   ?OT Treatment/Interventions: Self-care/ADL training;Therapeutic exercise;DME and/or AE instruction;Therapeutic activities;Patient/family education;Balance training  ?  ?OT Goals(Current goals can be found in the care plan section) Acute Rehab OT Goals ?Patient Stated Goal: decrease pain ?OT Goal Formulation: With patient ?Time For Goal Achievement: 09/12/21 ?Potential to Achieve Goals: Good ?ADL Goals ?Pt  Will Perform Upper Body Dressing: Independently;sitting ?Pt Will Perform Lower Body Dressing: with min guard assist;sit to/from stand;sitting/lateral leans;with adaptive equipment ?Pt Will Transfer to Toilet: regular height toilet;with min guard assist;ambulating ?Pt Will Perform Tub/Shower Transfer: with min assist;ambulating;3 in 1  ?OT Frequency: Min 2X/week ?  ? ?Co-evaluation   ?  ?  ?  ?  ? ?  ?AM-PAC OT "6 Clicks" Daily Activity     ?Outcome Measure Help from another person eating meals?: None ?Help from another person taking care of personal grooming?: A Little ?Help from another person toileting, which includes using toliet, bedpan, or urinal?: A Little ?Help from another person bathing (including washing, rinsing, drying)?: A Lot ?Help from another person to put on and taking off regular upper body clothing?: None ?Help from another person to put on and taking off regular lower body clothing?: A Lot ?6 Click Score: 18 ?  ?End of Session Equipment Utilized During Treatment: Gait belt;Rolling walker (2 wheels) ?Nurse Communication: Mobility status ? ?Activity Tolerance: Patient tolerated treatment well ?Patient left: in bed;with call bell/phone within reach ? ?OT Visit Diagnosis: Unsteadiness on feet (R26.81);Other abnormalities of gait and mobility (R26.89);Muscle weakness (generalized) (M62.81)  ?              ?Time: 1610-9604 ?OT Time Calculation (min): 30 min ?Charges:  OT General Charges ?$OT Visit: 1 Visit ?OT Evaluation ?$OT Eval Low Complexity: 1 Low ?OT Treatments ?$Self Care/Home Management : 8-22 mins ? ?Alfonzo Beers,  OTD, OTR/L ?Acute Rehab ?[phone] ? ?Mayer Masker ?08/29/2021, 9:13 AM ?

## 2021-08-29 NOTE — Progress Notes (Signed)
Orthopedic Tech Progress Note ?Patient Details:  ?Andrea Houston ?10-29-1955 ?[id number] ? ?Ortho Devices ?Type of Ortho Device: Lumbar corsett ?Ortho Device/Splint Interventions: Ordered, Application, Adjustment ? Delivered to room. Patient didn't want to get up to put on brace. ?Post Interventions ?Patient Tolerated: Well ?Instructions Provided: Care of device, Adjustment of device ? ?[name] ?08/29/2021, 6:00 AM ? ?

## 2021-08-29 NOTE — TOC Progression Note (Addendum)
Transition of Care (TOC) - Progression Note  ? ? ?Patient Details  ?Name: Andrea Houston ?MRN: [id number] ?Date of Birth: Aug 28, 1955 ? ?Transition of Care (TOC) CM/SW Contact  ?Kingsley Plan, RN ?Phone Number: ?08/29/2021, 12:21 PM ? ?Clinical Narrative:    ?Confirmed face sheet information with patient. Discussed HHPT/OT. Patient in agreement . Secure chatted MD, nurse and NP for orders and face to face.  ? ?Sent referral to [facility] with [facility] awaiting determination.  ? ?3C staff will provide any needed DME  ? ?Kandee Keen with [facility] has accepted referral ?Expected Discharge Plan: Home w Home Health Services ?Barriers to Discharge: Continued Medical Work up ? ?Expected Discharge Plan and Services ?Expected Discharge Plan: Home w Home Health Services ?  ?Discharge Planning Services: CM Consult ?Post Acute Care Choice: Home Health ?Living arrangements for the past 2 months: Single Family Home ?                ?DME Arranged: N/A ?  ?  ?  ?  ?HH Arranged: PT, OT ?HH Agency: [facility] ?Date HH Agency Contacted: 08/29/21 ?Time HH Agency Contacted: 1221 ?Representative spoke with at [facility] Agency: Kandee Keen awaiting determination ? ? ?Social Determinants of Health (SDOH) Interventions ?  ? ?Readmission Risk Interventions ?   ? View : No data to display.  ?  ?  ?  ? ? ?

## 2021-08-29 NOTE — Progress Notes (Signed)
Subjective: ?Patient reports back pain and leg pain  ? ?Objective: ?Vital signs in last 24 hours: ?Temp:  [97.4 ?F (36.3 ?C)-98.4 ?F (36.9 ?C)] 98.4 ?F (36.9 ?C) (05/16 0726) ?Pulse Rate:  [72-97] 97 (05/16 0726) ?Resp:  [12-18] 16 (05/16 0726) ?BP: (104-156)/(58-92) 125/73 (05/16 0726) ?SpO2:  [94 %-100 %] 97 % (05/16 0726) ?FiO2 (%):  [2 %] 2 % (05/16 0002) ?Weight:  [68.5 kg] 68.5 kg (05/15 0910) ? ?Intake/Output from previous day: ?05/15 0701 - 05/16 0700 ?In: 2520 [P.O.:120; I.V.:2000; IV Piggyback:300] ?Out: 520 [Urine:270; Blood:250] ?Intake/Output this shift: ?No intake/output data recorded. ? ?Neurologic: Grossly normal ? ?Lab Results: ?Lab Results  ?Component Value Date  ? WBC 8.7 08/25/2021  ? HGB 13.6 08/25/2021  ? HCT 43.4 08/25/2021  ? MCV 88.4 08/25/2021  ? PLT 258 08/25/2021  ? ?Lab Results  ?Component Value Date  ? INR 1.1 08/25/2021  ? ?BMET ?Lab Results  ?Component Value Date  ? NA 141 08/25/2021  ? K 3.9 08/25/2021  ? CL 104 08/25/2021  ? CO2 28 08/25/2021  ? GLUCOSE 105 (H) 08/25/2021  ? BUN 17 08/25/2021  ? CREATININE 1.17 (H) 08/25/2021  ? CALCIUM 9.6 08/25/2021  ? ? ?Studies/Results: ?DG Lumbar Spine 2-3 Views ? ?Result Date: 08/28/2021 ?CLINICAL DATA:  Lumbar fusion surgery. EXAM: LUMBAR SPINE - 2-3 VIEW COMPARISON:  Lumbar spine x-ray 04/10/2021. FINDINGS: Intraoperative lumbar spine. Four low resolution intraoperative spot views of the lumbar spine were obtained. There is new L4-S1 posterior fusion hardware with disc space grafts. Alignment appears anatomic. Total fluoroscopy time: 1 minute 22 seconds Total radiation dose: 50.33 micro Gy IMPRESSION: Intraoperative L4-S1 posterior fusion Electronically Signed   By: Darliss Cheney M.D.   On: 08/28/2021 17:06  ? ?DG C-Arm 1-60 Min-No Report ? ?Result Date: 08/28/2021 ?Fluoroscopy was utilized by the requesting physician.  No radiographic interpretation.  ? ?DG C-Arm 1-60 Min-No Report ? ?Result Date: 08/28/2021 ?Fluoroscopy was utilized by the  requesting physician.  No radiographic interpretation.  ? ?DG C-Arm 1-60 Min-No Report ? ?Result Date: 08/28/2021 ?Fluoroscopy was utilized by the requesting physician.  No radiographic interpretation.   ? ?Assessment/Plan: ?Postop day 2 two level PLIF. Will work on pain management and therapy today. If she feels up to it she can go home later today. Otherwise we will discharge her in the morning  ? ? LOS: 1 day  ? ? ?Andrea Houston ?08/29/2021, 8:06 AM ? ? ?  ?

## 2021-08-30 MED ORDER — OXYCODONE-ACETAMINOPHEN 5-325 MG PO TABS: 1.0000 | ORAL_TABLET | ORAL | 0 refills | Status: AC | PRN

## 2021-08-30 MED ORDER — METHOCARBAMOL 750 MG PO TABS: 750.0000 mg | ORAL_TABLET | Freq: Four times a day (QID) | ORAL | 0 refills | Status: AC

## 2021-08-30 NOTE — Discharge Summary (Signed)
Physician Discharge Summary  ?Patient ID: ?Rodman Comp ?MRN: [id number] ?DOB/AGE: December 30, 1955 66 y.o. ? ?Admit date: 08/28/2021 ?Discharge date: 08/30/2021 ? ?Admission Diagnoses: Degenerative spondylolisthesis L4-5 with spinal stenosis, degenerative disc disease L5-S1 with calcified disc herniation with spinal stenosis, back pain with right more than left leg pain ?  ? ? ?Discharge Diagnoses: same ? ? ?Discharged Condition: good ? ?Hospital Course: The patient was admitted on 08/28/2021 and taken to the operating room where the patient underwent PLIF L4-5. The patient tolerated the procedure well and was taken to the recovery room and then to the floor in stable condition. The hospital course was routine. There were no complications. The wound remained clean dry and intact. Pt had appropriate back soreness. No complaints of leg pain or new N/T/W. The patient remained afebrile with stable vital signs, and tolerated a regular diet. The patient continued to increase activities, and pain was well controlled with oral pain medications.  ? ?Consults: None ? ?Significant Diagnostic Studies:  ?Results for orders placed or performed during the hospital encounter of 08/28/21  ?ABO/Rh  ?Result Value Ref Range  ? ABO/RH(D)    ?  O POS ?Performed at [facility], [place] [address] [place], [place] [address] ?  ? ? ?DG Lumbar Spine 2-3 Views ? ?Result Date: 08/28/2021 ?CLINICAL DATA:  Lumbar fusion surgery. EXAM: LUMBAR SPINE - 2-3 VIEW COMPARISON:  Lumbar spine x-ray 04/10/2021. FINDINGS: Intraoperative lumbar spine. Four low resolution intraoperative spot views of the lumbar spine were obtained. There is new L4-S1 posterior fusion hardware with disc space grafts. Alignment appears anatomic. Total fluoroscopy time: 1 minute 22 seconds Total radiation dose: 50.33 micro Gy IMPRESSION: Intraoperative L4-S1 posterior fusion Electronically Signed   By: Ronney Asters M.D.   On: 08/28/2021 17:06  ? ?DG C-Arm 1-60 Min-No  Report ? ?Result Date: 08/28/2021 ?Fluoroscopy was utilized by the requesting physician.  No radiographic interpretation.  ? ?DG C-Arm 1-60 Min-No Report ? ?Result Date: 08/28/2021 ?Fluoroscopy was utilized by the requesting physician.  No radiographic interpretation.  ? ?DG C-Arm 1-60 Min-No Report ? ?Result Date: 08/28/2021 ?Fluoroscopy was utilized by the requesting physician.  No radiographic interpretation.   ? ?Antibiotics:  ?Anti-infectives (From admission, onward)  ? ? Start     Dose/Rate Route Frequency Ordered Stop  ? 08/29/21 0700  nitrofurantoin (macrocrystal-monohydrate) (MACROBID) capsule 100 mg       ? 100 mg Oral Every morning 08/28/21 1636    ? 08/28/21 1730  ceFAZolin (ANCEF) IVPB 2g/100 mL premix       ? 2 g ?200 mL/hr over 30 Minutes Intravenous Every 8 hours 08/28/21 1636 08/29/21 0203  ? 08/28/21 0915  ceFAZolin (ANCEF) IVPB 2g/100 mL premix       ? 2 g ?200 mL/hr over 30 Minutes Intravenous On call to O.R. 08/28/21 0907 08/28/21 1136  ? ?  ? ? ?Discharge Exam: ?Blood pressure 111/66, pulse 79, temperature 97.8 ?F (36.6 ?C), resp. rate 16, height 5\' 1"  (1.549 m), weight 68.5 kg, SpO2 97 %. ?Neurologic: Grossly normal ?Ambulating and voiding well incision cdi  ? ?Discharge Medications:   ?Allergies as of 08/30/2021   ? ?   Reactions  ? Codeine Nausea And Vomiting, Nausea Only  ? Patient tolerates with a anti-nausea medication.  ? ?  ? ?  ?Medication List  ?  ? ?TAKE these medications   ? ?cloNIDine 0.1 MG tablet ?Commonly known as: CATAPRES ?Take 0.1 mg by mouth at bedtime. ?  ?clopidogrel  75 MG tablet ?Commonly known as: PLAVIX ?Take 75 mg by mouth daily. ?  ?cyclobenzaprine 5 MG tablet ?Commonly known as: FLEXERIL ?Take 5 mg by mouth 2 (two) times daily. ?  ?ezetimibe 10 MG tablet ?Commonly known as: ZETIA ?Take 10 mg by mouth in the morning. ?  ?Fenofibric Acid 135 MG Cpdr ?Take 135 mg by mouth at bedtime. ?  ?furosemide 40 MG tablet ?Commonly known as: LASIX ?Take 40 mg by mouth See admin  instructions. Take 1 tablet (40 mg) by mouth scheduled every morning & may take an additional dose in the afternoon if still swollen. ?  ?HYDROcodone-acetaminophen 5-325 MG tablet ?Commonly known as: NORCO/VICODIN ?Take 1 tablet by mouth 2 (two) times daily as needed. ?  ?ipratropium-albuterol 0.5-2.5 (3) MG/3ML Soln ?Commonly known as: DUONEB ?Take 3 mLs by nebulization every 6 (six) hours as needed (wheezing/shortness of breath). ?  ?Krill Oil 500 MG Caps ?Take 500 mg by mouth in the morning. ?  ?LORazepam 0.5 MG tablet ?Commonly known as: ATIVAN ?Take 0.5 mg by mouth 3 (three) times daily as needed for anxiety. ?  ?methocarbamol 500 MG tablet ?Commonly known as: ROBAXIN ?Take 1 tablet (500 mg total) by mouth 2 (two) times daily. ?What changed: Another medication with the same name was added. Make sure you understand how and when to take each. ?  ?methocarbamol 750 MG tablet ?Commonly known as: Robaxin-750 ?Take 1 tablet (750 mg total) by mouth 4 (four) times daily. ?What changed: You were already taking a medication with the same name, and this prescription was added. Make sure you understand how and when to take each. ?  ?metoprolol succinate 50 MG 24 hr tablet ?Commonly known as: TOPROL-XL ?Take 50 mg by mouth in the morning. ?  ?nitrofurantoin (macrocrystal-monohydrate) 100 MG capsule ?Commonly known as: MACROBID ?Take 100 mg by mouth in the morning. ?  ?nystatin 100000 UNIT/ML suspension ?Commonly known as: MYCOSTATIN ?Take 10-20 mLs by mouth 2 (two) times daily as needed (oral thrush). ?  ?ondansetron 4 MG tablet ?Commonly known as: ZOFRAN ?Take 4 mg by mouth every 8 (eight) hours as needed for nausea. ?  ?oxyCODONE-acetaminophen 5-325 MG tablet ?Commonly known as: PERCOCET/ROXICET ?Take 0.5-1 tablets by mouth every 6 (six) hours as needed for severe pain. ?What changed: Another medication with the same name was added. Make sure you understand how and when to take each. ?  ?oxyCODONE-acetaminophen 5-325 MG  tablet ?Commonly known as: Percocet ?Take 1 tablet by mouth every 4 (four) hours as needed for severe pain. ?What changed: You were already taking a medication with the same name, and this prescription was added. Make sure you understand how and when to take each. ?  ?pantoprazole 40 MG tablet ?Commonly known as: PROTONIX ?Take 40 mg by mouth in the morning. ?  ?PARoxetine 40 MG tablet ?Commonly known as: PAXIL ?Take 40 mg by mouth in the morning. ?  ?PARoxetine 20 MG tablet ?Commonly known as: PAXIL ?Take 20 mg by mouth every evening. ?  ?POTASSIUM PO ?Take 1 tablet by mouth in the morning. ?  ?promethazine-dextromethorphan 6.25-15 MG/5ML syrup ?Commonly known as: PROMETHAZINE-DM ?Take 5 mLs by mouth every 4 (four) hours as needed. ?  ?rosuvastatin 40 MG tablet ?Commonly known as: CRESTOR ?Take 40 mg by mouth at bedtime. ?  ?temazepam 30 MG capsule ?Commonly known as: RESTORIL ?Take 30 mg by mouth at bedtime. ?  ?Trelegy Ellipta 100-62.5-25 MCG/ACT Aepb ?Generic drug: Fluticasone-Umeclidin-Vilant ?Inhale 1 puff into the lungs every other  day. ?  ?Ventolin HFA 108 (90 Base) MCG/ACT inhaler ?Generic drug: albuterol ?Inhale 1-2 puffs into the lungs every 6 (six) hours as needed for wheezing or shortness of breath. ?  ?VITAMIN D3 PO ?Take 1 tablet by mouth every evening. ?  ? ?  ? ?  ?  ? ? ?  ?Durable Medical Equipment  ?(From admission, onward)  ?  ? ? ?  ? ?  Start     Ordered  ? 08/28/21 1637  DME Walker rolling  Once       ?Question:  Patient needs a walker to treat with the following condition  Answer:  S/P lumbar fusion  ? 08/28/21 1636  ? 08/28/21 1637  DME 3 n 1  Once       ? 08/28/21 1636  ? ?  ?  ? ?  ? ? ?Disposition: home  ? ?Final Dx: PLIF L4-5 ? ?Discharge Instructions   ? ? Call MD for:  difficulty breathing, headache or visual disturbances   Complete by: As directed ?  ? Call MD for:  hives   Complete by: As directed ?  ? Call MD for:  persistant nausea and vomiting   Complete by: As directed ?  ? Call  MD for:  redness, tenderness, or signs of infection (pain, swelling, redness, odor or green/yellow discharge around incision site)   Complete by: As directed ?  ? Call MD for:  severe uncontrolled pain   Complete by: A

## 2021-08-30 NOTE — Progress Notes (Signed)
Physical Therapy Treatment ?Patient Details ?Name: Andrea Houston ?MRN: [id number] ?DOB: [birth year] ?Today's Date: 08/30/2021 ? ? ?History of Present Illness Pt is a 66 y/o F who presents s/p decompressive lumbar laminectomy, hemi facetectomy,and foraminectomies L4-S1 on 08/28/2021. PMH significant for CAD, COPD, and anxiety. ? ?  ?PT Comments  ? ? Pt progressing well with post-op mobility. She was able to demonstrate transfers and ambulation with gross modified independence to supervision for safety and RW for support. Pt was educated on precautions, brace application/wearing schedule, appropriate activity progression, and car transfer. Will continue to follow.   ?   ?Recommendations for follow up therapy are one component of a multi-disciplinary discharge planning process, led by the attending physician.  Recommendations may be updated based on patient status, additional functional criteria and insurance authorization. ? ?Follow Up Recommendations ? Home health PT ?  ?  ?Assistance Recommended at Discharge Frequent or constant Supervision/Assistance  ?Patient can return home with the following A little help with walking and/or transfers;A little help with bathing/dressing/bathroom;Assistance with cooking/housework;Assist for transportation;Help with stairs or ramp for entrance ?  ?Equipment Recommendations ? Rolling walker (2 wheels);BSC/3in1  ?  ?Recommendations for Other Services   ? ? ?  ?Precautions / Restrictions Precautions ?Precautions: Back ?Precaution Booklet Issued: Yes (comment) ?Precaution Comments: Reviewed handout and pt was cued for precautions during functional mobility. ?Required Braces or Orthoses: Spinal Brace ?Spinal Brace: Lumbar corset;Applied in sitting position ?Restrictions ?Weight Bearing Restrictions: No  ?  ? ?Mobility ? Bed Mobility ?Overal bed mobility: Modified Independent ?Bed Mobility: Rolling, Sidelying to Sit ?  ?  ?  ?  ?  ?General bed mobility comments: HOB slightly elebvated and  min use of rails. Good log roll technique. ?  ? ?Transfers ?Overall transfer level: Modified independent ?Equipment used: Rolling walker (2 wheels) ?Transfers: Sit to/from Stand ?  ?  ?  ?  ?  ?  ?General transfer comment: Pt demonstrated proper hand placement on seated surface for safety. No assist required to power up to full stand. ?  ? ?Ambulation/Gait ?Ambulation/Gait assistance: Supervision ?Gait Distance (Feet): 200 Feet ?Assistive device: Rolling walker (2 wheels) ?Gait Pattern/deviations: Step-through pattern, Decreased stride length, Trunk flexed, Knee flexed in stance - right, Knee flexed in stance - left ?Gait velocity: Decreased ?Gait velocity interpretation: 1.31 - 2.62 ft/sec, indicative of limited community ambulator ?  ?General Gait Details: Decreased gait speed but overall steady with RW for support. Pt motivated to ambulate today. ? ? ?Stairs ?Stairs: Yes ?Stairs assistance: Min guard ?Stair Management: Two rails, Step to pattern, Forwards ?Number of Stairs: 2 ?General stair comments: Close guard for safety. Pt was able to negotiate stairs without difficulty. VC's for sequencing and general safety. ? ? ?Wheelchair Mobility ?  ? ?Modified Rankin (Stroke Patients Only) ?  ? ? ?  ?Balance Overall balance assessment: Mild deficits observed, not formally tested ?  ?  ?  ?  ?  ?  ?  ?  ?  ?  ?  ?  ?  ?  ?  ?  ?  ?  ?  ? ?  ?Cognition Arousal/Alertness: Awake/alert ?Behavior During Therapy: [facility] for tasks assessed/performed ?Overall Cognitive Status: Within Functional Limits for tasks assessed ?  ?  ?  ?  ?  ?  ?  ?  ?  ?  ?  ?  ?  ?  ?  ?  ?  ?  ?  ? ?  ?Exercises   ? ?  ?  General Comments General comments (skin integrity, edema, etc.): VSS on RA ?  ?  ? ?Pertinent Vitals/Pain Pain Assessment ?Pain Assessment: Faces ?Faces Pain Scale: Hurts little more ?Pain Location: Back ?Pain Descriptors / Indicators: Operative site guarding, Sore, Aching ?Pain Intervention(s): Limited activity within patient's  tolerance, Monitored during session, Repositioned  ? ? ?Home Living   ?  ?  ?  ?  ?  ?  ?  ?  ?  ?   ?  ?Prior Function    ?  ?  ?   ? ?PT Goals (current goals can now be found in the care plan section) Acute Rehab PT Goals ?Patient Stated Goal: Return home with family support ?PT Goal Formulation: With patient ?Time For Goal Achievement: 09/05/21 ?Potential to Achieve Goals: Good ?Progress towards PT goals: Progressing toward goals ? ?  ?Frequency ? ? ? Min 5X/week ? ? ? ?  ?PT Plan Current plan remains appropriate  ? ? ?Co-evaluation   ?  ?  ?  ?  ? ?  ?AM-PAC PT "6 Clicks" Mobility   ?Outcome Measure ? Help needed turning from your back to your side while in a flat bed without using bedrails?: None ?Help needed moving from lying on your back to sitting on the side of a flat bed without using bedrails?: None ?Help needed moving to and from a bed to a chair (including a wheelchair)?: None ?Help needed standing up from a chair using your arms (e.g., wheelchair or bedside chair)?: None ?Help needed to walk in hospital room?: A Little ?Help needed climbing 3-5 steps with a railing? : A Little ?6 Click Score: 22 ? ?  ?End of Session Equipment Utilized During Treatment: Gait belt;Back brace ?Activity Tolerance: Patient tolerated treatment well ?Patient left: in bed;with call bell/phone within reach ?Nurse Communication: Mobility status ?PT Visit Diagnosis: Unsteadiness on feet (R26.81);Pain ?Pain - part of body:  (back) ?  ? ? ?Time: [id number] ?PT Time Calculation (min) (ACUTE ONLY): 15 min ? ?Charges:  $Gait Training: 8-22 mins          ?          ? ?Rolinda Roan, PT, DPT ?Acute Rehabilitation Services ?Secure Chat Preferred ?Office: [phone]  ? ? ?Thelma Comp ?08/30/2021, 11:58 AM ? ?

## 2021-08-30 NOTE — Progress Notes (Signed)
Occupational Therapy Treatment ?Patient Details ?Name: Andrea Houston ?MRN: [id number] ?DOB: [birth year] ?Today's Date: 08/30/2021 ? ? ?History of present illness Pt is a 66 y/o F who presents s/p decompressive lumbar laminectomy, hemi facetectomy,and foraminectomies L4-S1 on 08/28/2021. PMH significant for CAD, COPD, and anxiety. ?  ?OT comments ? Pt progressing towards goals, reviewed precautions, compensatory strategies, brace wear, and use of DME with pt. Educated/demo'd car transfer technique for pt. Pt reports feeling better today, able to walk hallway distance min guard A with RW. Pt able to complete UB dressing with supervision. Pt presenting with impairments listed below, will follow acutely. Continue to recommend HHOT at d/c.  ? ?Recommendations for follow up therapy are one component of a multi-disciplinary discharge planning process, led by the attending physician.  Recommendations may be updated based on patient status, additional functional criteria and insurance authorization. ?   ?Follow Up Recommendations ? Home health OT  ?  ?Assistance Recommended at Discharge Intermittent Supervision/Assistance  ?Patient can return home with the following ? A little help with walking and/or transfers;A little help with bathing/dressing/bathroom;Assistance with cooking/housework;Assist for transportation;Help with stairs or ramp for entrance ?  ?Equipment Recommendations ? BSC/3in1  ?  ?Recommendations for Other Services PT consult ? ?  ?Precautions / Restrictions Precautions ?Precautions: Back ?Precaution Booklet Issued: Yes (comment) ?Precaution Comments: Reviewed handout and pt was cued for precautions during functional mobility. ?Required Braces or Orthoses: Spinal Brace ?Spinal Brace: Lumbar corset;Applied in sitting position ?Restrictions ?Weight Bearing Restrictions: No  ? ? ?  ? ?Mobility Bed Mobility ?  ?  ?  ?  ?  ?  ?  ?General bed mobility comments: sitting EOB upon arrival ?  ? ?Transfers ?Overall  transfer level: Needs assistance ?Equipment used: Rolling walker (2 wheels) ?Transfers: Sit to/from Stand ?Sit to Stand: Min guard ?  ?  ?  ?  ?  ?  ?  ?  ?Balance Overall balance assessment: Mild deficits observed, not formally tested ?  ?  ?  ?  ?  ?  ?  ?  ?  ?  ?  ?  ?  ?  ?  ?  ?  ?  ?   ? ?ADL either performed or assessed with clinical judgement  ? ?ADL Overall ADL's : Needs assistance/impaired ?  ?  ?  ?  ?  ?  ?  ?  ?Upper Body Dressing : Supervision/safety;Sitting ?Upper Body Dressing Details (indicate cue type and reason): to doff brace ?  ?  ?Toilet Transfer: Min guard;Rolling walker (2 wheels);Regular Toilet;Ambulation ?Toilet Transfer Details (indicate cue type and reason): simulated in room ?  ?  ?  ?  ?Functional mobility during ADLs: Min guard;Rolling walker (2 wheels) ?  ?  ? ?Extremity/Trunk Assessment Upper Extremity Assessment ?Upper Extremity Assessment: Overall WFL for tasks assessed ?  ?Lower Extremity Assessment ?Lower Extremity Assessment: Defer to PT evaluation ?  ?  ?  ? ?Vision   ?Vision Assessment?: No apparent visual deficits ?  ?Perception Perception ?Perception: Not tested ?  ?Praxis Praxis ?Praxis: Not tested ?  ? ?Cognition Arousal/Alertness: Awake/alert ?Behavior During Therapy: [facility] for tasks assessed/performed ?Overall Cognitive Status: Within Functional Limits for tasks assessed ?  ?  ?  ?  ?  ?  ?  ?  ?  ?  ?  ?  ?  ?  ?  ?  ?  ?  ?  ?   ?Exercises   ? ?  ?Shoulder Instructions   ? ? ?  ?  General Comments VSS on RA  ? ? ?Pertinent Vitals/ Pain       Pain Assessment ?Pain Assessment: Faces ?Pain Score: 6  ?Faces Pain Scale: Hurts even more ?Pain Location: Back ?Pain Descriptors / Indicators: Operative site guarding, Sore, Aching ?Pain Intervention(s): Limited activity within patient's tolerance, Monitored during session, Repositioned ? ?Home Living   ?  ?  ?  ?  ?  ?  ?  ?  ?  ?  ?  ?  ?  ?  ?  ?  ?  ?  ? ?  ?Prior Functioning/Environment    ?  ?  ?  ?   ? ?Frequency ? Min 2X/week   ? ? ? ? ?  ?Progress Toward Goals ? ?OT Goals(current goals can now be found in the care plan section) ? Progress towards OT goals: Progressing toward goals ? ?Acute Rehab OT Goals ?Patient Stated Goal: to go home ?OT Goal Formulation: With patient ?Time For Goal Achievement: 09/12/21 ?Potential to Achieve Goals: Good ?ADL Goals ?Pt Will Perform Upper Body Dressing: Independently;sitting ?Pt Will Perform Lower Body Dressing: with min guard assist;sit to/from stand;sitting/lateral leans;with adaptive equipment ?Pt Will Transfer to Toilet: regular height toilet;with min guard assist;ambulating ?Pt Will Perform Tub/Shower Transfer: with min assist;ambulating;3 in 1  ?Plan Discharge plan remains appropriate;Frequency remains appropriate   ? ?Co-evaluation ? ? ?   ?  ?  ?  ?  ? ?  ?AM-PAC OT "6 Clicks" Daily Activity     ?Outcome Measure ? ? Help from another person eating meals?: None ?Help from another person taking care of personal grooming?: A Little ?Help from another person toileting, which includes using toliet, bedpan, or urinal?: A Little ?Help from another person bathing (including washing, rinsing, drying)?: A Lot ?Help from another person to put on and taking off regular upper body clothing?: None ?Help from another person to put on and taking off regular lower body clothing?: A Lot ?6 Click Score: 18 ? ?  ?End of Session Equipment Utilized During Treatment: Gait belt;Rolling walker (2 wheels) ? ?OT Visit Diagnosis: Unsteadiness on feet (R26.81);Other abnormalities of gait and mobility (R26.89);Muscle weakness (generalized) (M62.81) ?  ?Activity Tolerance Patient tolerated treatment well ?  ?Patient Left in bed;with call bell/phone within reach ?  ?Nurse Communication Mobility status ?  ? ?   ? ?Time: 2423-5361 ?OT Time Calculation (min): 13 min ? ?Charges: OT General Charges ?$OT Visit: 1 Visit ?OT Treatments ?$Self Care/Home Management : 8-22 mins ? ?Alfonzo Beers, OTD, OTR/L ?Acute Rehab ?[phone] ? ? ?Mayer Masker ?08/30/2021, 8:45 AM ?

## 2021-08-30 NOTE — Progress Notes (Signed)
Pt doing well. Pt given D/C instructions with verbal understanding. Pt's incision is clean and dry with no sign of infection. Pt's IV was removed prior to D/C. Pt D/C'd home via wheelchair per MD order. Pt received 3-n-1 and RW prior to D/C. Pt D/C'd home via wheelchair per MD order. Pt is stable @ D/C and has no other needs at this time. Rema Fendt, RN  ?

## 2021-08-31 DIAGNOSIS — Z9181 History of falling: Secondary | ICD-10-CM | POA: Diagnosis not present

## 2021-08-31 DIAGNOSIS — Z955 Presence of coronary angioplasty implant and graft: Secondary | ICD-10-CM | POA: Diagnosis not present

## 2021-08-31 DIAGNOSIS — M5137 Other intervertebral disc degeneration, lumbosacral region: Secondary | ICD-10-CM | POA: Diagnosis not present

## 2021-08-31 DIAGNOSIS — Z87891 Personal history of nicotine dependence: Secondary | ICD-10-CM | POA: Diagnosis not present

## 2021-08-31 DIAGNOSIS — Z7902 Long term (current) use of antithrombotics/antiplatelets: Secondary | ICD-10-CM | POA: Diagnosis not present

## 2021-08-31 DIAGNOSIS — M4316 Spondylolisthesis, lumbar region: Secondary | ICD-10-CM | POA: Diagnosis not present

## 2021-08-31 DIAGNOSIS — M4807 Spinal stenosis, lumbosacral region: Secondary | ICD-10-CM | POA: Diagnosis not present

## 2021-08-31 DIAGNOSIS — Z981 Arthrodesis status: Secondary | ICD-10-CM | POA: Diagnosis not present

## 2021-08-31 DIAGNOSIS — G4733 Obstructive sleep apnea (adult) (pediatric): Secondary | ICD-10-CM | POA: Diagnosis not present

## 2021-08-31 DIAGNOSIS — Z4789 Encounter for other orthopedic aftercare: Secondary | ICD-10-CM | POA: Diagnosis not present

## 2021-08-31 DIAGNOSIS — N289 Disorder of kidney and ureter, unspecified: Secondary | ICD-10-CM | POA: Diagnosis not present

## 2021-08-31 DIAGNOSIS — I2510 Atherosclerotic heart disease of native coronary artery without angina pectoris: Secondary | ICD-10-CM | POA: Diagnosis not present

## 2021-08-31 DIAGNOSIS — F419 Anxiety disorder, unspecified: Secondary | ICD-10-CM | POA: Diagnosis not present

## 2021-08-31 DIAGNOSIS — Z7951 Long term (current) use of inhaled steroids: Secondary | ICD-10-CM | POA: Diagnosis not present

## 2021-08-31 DIAGNOSIS — J449 Chronic obstructive pulmonary disease, unspecified: Secondary | ICD-10-CM | POA: Diagnosis not present

## 2021-08-31 DIAGNOSIS — Z79891 Long term (current) use of opiate analgesic: Secondary | ICD-10-CM | POA: Diagnosis not present

## 2021-09-14 ENCOUNTER — Other Ambulatory Visit (HOSPITAL_COMMUNITY): Payer: Self-pay | Admitting: Neurological Surgery

## 2021-09-14 ENCOUNTER — Ambulatory Visit (HOSPITAL_COMMUNITY)
Admission: RE | Admit: 2021-09-14 | Discharge: 2021-09-14 | Disposition: A | Discharge status: Home / Self Care | Payer: Medicare Other | Source: Ambulatory Visit | Attending: Neurological Surgery | Admitting: Neurological Surgery

## 2021-09-14 DIAGNOSIS — M7989 Other specified soft tissue disorders: Secondary | ICD-10-CM | POA: Diagnosis not present

## 2021-09-27 DIAGNOSIS — F419 Anxiety disorder, unspecified: Secondary | ICD-10-CM | POA: Diagnosis not present

## 2021-09-27 DIAGNOSIS — R609 Edema, unspecified: Secondary | ICD-10-CM | POA: Diagnosis not present

## 2021-09-27 DIAGNOSIS — R110 Nausea: Secondary | ICD-10-CM | POA: Diagnosis not present

## 2021-09-27 DIAGNOSIS — G4700 Insomnia, unspecified: Secondary | ICD-10-CM | POA: Diagnosis not present

## 2021-09-28 DIAGNOSIS — M5416 Radiculopathy, lumbar region: Secondary | ICD-10-CM | POA: Diagnosis not present

## 2021-09-28 DIAGNOSIS — Z6828 Body mass index (BMI) 28.0-28.9, adult: Secondary | ICD-10-CM | POA: Diagnosis not present

## 2021-09-28 DIAGNOSIS — M4316 Spondylolisthesis, lumbar region: Secondary | ICD-10-CM | POA: Diagnosis not present

## 2021-10-04 DIAGNOSIS — F419 Anxiety disorder, unspecified: Secondary | ICD-10-CM | POA: Diagnosis not present

## 2021-10-04 DIAGNOSIS — I2510 Atherosclerotic heart disease of native coronary artery without angina pectoris: Secondary | ICD-10-CM | POA: Diagnosis not present

## 2021-10-04 DIAGNOSIS — Z79891 Long term (current) use of opiate analgesic: Secondary | ICD-10-CM | POA: Diagnosis not present

## 2021-10-04 DIAGNOSIS — Z87891 Personal history of nicotine dependence: Secondary | ICD-10-CM | POA: Diagnosis not present

## 2021-10-04 DIAGNOSIS — Z4789 Encounter for other orthopedic aftercare: Secondary | ICD-10-CM | POA: Diagnosis not present

## 2021-10-04 DIAGNOSIS — M4807 Spinal stenosis, lumbosacral region: Secondary | ICD-10-CM | POA: Diagnosis not present

## 2021-10-04 DIAGNOSIS — Z981 Arthrodesis status: Secondary | ICD-10-CM | POA: Diagnosis not present

## 2021-10-04 DIAGNOSIS — M5137 Other intervertebral disc degeneration, lumbosacral region: Secondary | ICD-10-CM | POA: Diagnosis not present

## 2021-10-04 DIAGNOSIS — G4733 Obstructive sleep apnea (adult) (pediatric): Secondary | ICD-10-CM | POA: Diagnosis not present

## 2021-10-04 DIAGNOSIS — N289 Disorder of kidney and ureter, unspecified: Secondary | ICD-10-CM | POA: Diagnosis not present

## 2021-10-04 DIAGNOSIS — Z9181 History of falling: Secondary | ICD-10-CM | POA: Diagnosis not present

## 2021-10-04 DIAGNOSIS — Z7902 Long term (current) use of antithrombotics/antiplatelets: Secondary | ICD-10-CM | POA: Diagnosis not present

## 2021-10-04 DIAGNOSIS — J449 Chronic obstructive pulmonary disease, unspecified: Secondary | ICD-10-CM | POA: Diagnosis not present

## 2021-10-04 DIAGNOSIS — M4316 Spondylolisthesis, lumbar region: Secondary | ICD-10-CM | POA: Diagnosis not present

## 2021-10-04 DIAGNOSIS — Z955 Presence of coronary angioplasty implant and graft: Secondary | ICD-10-CM | POA: Diagnosis not present

## 2021-10-04 DIAGNOSIS — Z7951 Long term (current) use of inhaled steroids: Secondary | ICD-10-CM | POA: Diagnosis not present

## 2021-10-14 DEATH — deceased

## 2021-10-20 ENCOUNTER — Inpatient Hospital Stay (HOSPITAL_COMMUNITY): Payer: Medicare Other

## 2021-10-20 ENCOUNTER — Encounter (HOSPITAL_COMMUNITY): Payer: Self-pay | Admitting: Emergency Medicine

## 2021-10-20 ENCOUNTER — Inpatient Hospital Stay (HOSPITAL_COMMUNITY)
Admission: EM | Admit: 2021-10-20 | Discharge: 2021-11-14 | DRG: 862 | Disposition: E | Payer: Medicare Other | Attending: Internal Medicine | Admitting: Internal Medicine

## 2021-10-20 ENCOUNTER — Emergency Department (HOSPITAL_COMMUNITY): Payer: Medicare Other

## 2021-10-20 ENCOUNTER — Other Ambulatory Visit: Payer: Self-pay

## 2021-10-20 DIAGNOSIS — N200 Calculus of kidney: Secondary | ICD-10-CM | POA: Diagnosis not present

## 2021-10-20 DIAGNOSIS — Z781 Physical restraint status: Secondary | ICD-10-CM

## 2021-10-20 DIAGNOSIS — E872 Acidosis, unspecified: Secondary | ICD-10-CM | POA: Diagnosis present

## 2021-10-20 DIAGNOSIS — K3189 Other diseases of stomach and duodenum: Secondary | ICD-10-CM | POA: Diagnosis not present

## 2021-10-20 DIAGNOSIS — J9601 Acute respiratory failure with hypoxia: Secondary | ICD-10-CM | POA: Diagnosis not present

## 2021-10-20 DIAGNOSIS — J811 Chronic pulmonary edema: Secondary | ICD-10-CM | POA: Diagnosis not present

## 2021-10-20 DIAGNOSIS — I700 Atherosclerosis of aorta: Secondary | ICD-10-CM | POA: Diagnosis present

## 2021-10-20 DIAGNOSIS — G4730 Sleep apnea, unspecified: Secondary | ICD-10-CM | POA: Diagnosis present

## 2021-10-20 DIAGNOSIS — I2510 Atherosclerotic heart disease of native coronary artery without angina pectoris: Secondary | ICD-10-CM | POA: Diagnosis present

## 2021-10-20 DIAGNOSIS — E873 Alkalosis: Secondary | ICD-10-CM | POA: Diagnosis present

## 2021-10-20 DIAGNOSIS — Z66 Do not resuscitate: Secondary | ICD-10-CM | POA: Diagnosis not present

## 2021-10-20 DIAGNOSIS — I10 Essential (primary) hypertension: Secondary | ICD-10-CM | POA: Diagnosis present

## 2021-10-20 DIAGNOSIS — J9690 Respiratory failure, unspecified, unspecified whether with hypoxia or hypercapnia: Secondary | ICD-10-CM | POA: Diagnosis not present

## 2021-10-20 DIAGNOSIS — T8143XA Infection following a procedure, organ and space surgical site, initial encounter: Principal | ICD-10-CM | POA: Diagnosis present

## 2021-10-20 DIAGNOSIS — R6521 Severe sepsis with septic shock: Secondary | ICD-10-CM | POA: Diagnosis not present

## 2021-10-20 DIAGNOSIS — G062 Extradural and subdural abscess, unspecified: Secondary | ICD-10-CM | POA: Diagnosis present

## 2021-10-20 DIAGNOSIS — K5730 Diverticulosis of large intestine without perforation or abscess without bleeding: Secondary | ICD-10-CM | POA: Diagnosis not present

## 2021-10-20 DIAGNOSIS — I998 Other disorder of circulatory system: Secondary | ICD-10-CM | POA: Diagnosis not present

## 2021-10-20 DIAGNOSIS — M545 Low back pain, unspecified: Secondary | ICD-10-CM | POA: Insufficient documentation

## 2021-10-20 DIAGNOSIS — Z515 Encounter for palliative care: Secondary | ICD-10-CM | POA: Diagnosis not present

## 2021-10-20 DIAGNOSIS — G9341 Metabolic encephalopathy: Secondary | ICD-10-CM | POA: Diagnosis not present

## 2021-10-20 DIAGNOSIS — Y832 Surgical operation with anastomosis, bypass or graft as the cause of abnormal reaction of the patient, or of later complication, without mention of misadventure at the time of the procedure: Secondary | ICD-10-CM | POA: Diagnosis present

## 2021-10-20 DIAGNOSIS — G9340 Encephalopathy, unspecified: Secondary | ICD-10-CM | POA: Diagnosis present

## 2021-10-20 DIAGNOSIS — Z981 Arthrodesis status: Secondary | ICD-10-CM

## 2021-10-20 DIAGNOSIS — E876 Hypokalemia: Secondary | ICD-10-CM | POA: Diagnosis present

## 2021-10-20 DIAGNOSIS — Z452 Encounter for adjustment and management of vascular access device: Secondary | ICD-10-CM | POA: Diagnosis not present

## 2021-10-20 DIAGNOSIS — T8144XA Sepsis following a procedure, initial encounter: Secondary | ICD-10-CM | POA: Diagnosis present

## 2021-10-20 DIAGNOSIS — R152 Fecal urgency: Secondary | ICD-10-CM | POA: Diagnosis present

## 2021-10-20 DIAGNOSIS — Z955 Presence of coronary angioplasty implant and graft: Secondary | ICD-10-CM

## 2021-10-20 DIAGNOSIS — K55059 Acute (reversible) ischemia of intestine, part and extent unspecified: Secondary | ICD-10-CM | POA: Diagnosis not present

## 2021-10-20 DIAGNOSIS — Z79899 Other long term (current) drug therapy: Secondary | ICD-10-CM

## 2021-10-20 DIAGNOSIS — I739 Peripheral vascular disease, unspecified: Secondary | ICD-10-CM | POA: Diagnosis present

## 2021-10-20 DIAGNOSIS — A419 Sepsis, unspecified organism: Secondary | ICD-10-CM | POA: Diagnosis present

## 2021-10-20 DIAGNOSIS — F419 Anxiety disorder, unspecified: Secondary | ICD-10-CM | POA: Diagnosis present

## 2021-10-20 DIAGNOSIS — E785 Hyperlipidemia, unspecified: Secondary | ICD-10-CM | POA: Diagnosis present

## 2021-10-20 DIAGNOSIS — Z7951 Long term (current) use of inhaled steroids: Secondary | ICD-10-CM

## 2021-10-20 DIAGNOSIS — J90 Pleural effusion, not elsewhere classified: Secondary | ICD-10-CM | POA: Diagnosis not present

## 2021-10-20 DIAGNOSIS — I071 Rheumatic tricuspid insufficiency: Secondary | ICD-10-CM | POA: Diagnosis present

## 2021-10-20 DIAGNOSIS — Z4659 Encounter for fitting and adjustment of other gastrointestinal appliance and device: Secondary | ICD-10-CM | POA: Diagnosis not present

## 2021-10-20 DIAGNOSIS — E861 Hypovolemia: Secondary | ICD-10-CM | POA: Diagnosis present

## 2021-10-20 DIAGNOSIS — Z9049 Acquired absence of other specified parts of digestive tract: Secondary | ICD-10-CM

## 2021-10-20 DIAGNOSIS — M4316 Spondylolisthesis, lumbar region: Secondary | ICD-10-CM | POA: Diagnosis not present

## 2021-10-20 DIAGNOSIS — J449 Chronic obstructive pulmonary disease, unspecified: Secondary | ICD-10-CM | POA: Diagnosis present

## 2021-10-20 DIAGNOSIS — Z87891 Personal history of nicotine dependence: Secondary | ICD-10-CM

## 2021-10-20 DIAGNOSIS — E860 Dehydration: Secondary | ICD-10-CM | POA: Diagnosis present

## 2021-10-20 DIAGNOSIS — J984 Other disorders of lung: Secondary | ICD-10-CM | POA: Diagnosis not present

## 2021-10-20 DIAGNOSIS — Z7902 Long term (current) use of antithrombotics/antiplatelets: Secondary | ICD-10-CM

## 2021-10-20 DIAGNOSIS — Z20822 Contact with and (suspected) exposure to covid-19: Secondary | ICD-10-CM | POA: Diagnosis present

## 2021-10-20 DIAGNOSIS — R918 Other nonspecific abnormal finding of lung field: Secondary | ICD-10-CM | POA: Diagnosis not present

## 2021-10-20 LAB — LACTIC ACID, PLASMA
Lactic Acid, Venous: 8.3 mmol/L (ref 0.5–1.9)
Lactic Acid, Venous: >9 mmol/L (ref 0.5–1.9)

## 2021-10-20 LAB — URINALYSIS, ROUTINE W REFLEX MICROSCOPIC
Bilirubin Urine: NEGATIVE
Glucose, UA: NEGATIVE mg/dL
Ketones, ur: NEGATIVE mg/dL
Leukocytes,Ua: NEGATIVE
Nitrite: NEGATIVE
Protein, ur: 100 mg/dL — AB
Specific Gravity, Urine: 1.015 (ref 1.005–1.030)
pH: 5 (ref 5.0–8.0)

## 2021-10-20 LAB — COMPREHENSIVE METABOLIC PANEL
ALT: 37 U/L (ref 0–44)
AST: 166 U/L — ABNORMAL HIGH (ref 15–41)
Albumin: 2.6 g/dL — ABNORMAL LOW (ref 3.5–5.0)
Alkaline Phosphatase: 91 U/L (ref 38–126)
Anion gap: 14 (ref 5–15)
BUN: 9 mg/dL (ref 8–23)
CO2: 17 mmol/L — ABNORMAL LOW (ref 22–32)
Calcium: 9.7 mg/dL (ref 8.9–10.3)
Chloride: 107 mmol/L (ref 98–111)
Creatinine, Ser: 0.92 mg/dL (ref 0.44–1.00)
GFR, Estimated: >60 mL/min (ref >60)
Glucose, Bld: 86 mg/dL (ref 70–99)
Potassium: 3.1 mmol/L — ABNORMAL LOW (ref 3.5–5.1)
Sodium: 138 mmol/L (ref 135–145)
Total Bilirubin: 0.9 mg/dL (ref 0.3–1.2)
Total Protein: 6.7 g/dL (ref 6.5–8.1)

## 2021-10-20 LAB — CBC WITH DIFFERENTIAL/PLATELET
Abs Immature Granulocytes: 0.04 10*3/uL (ref 0.00–0.07)
Basophils Absolute: 0 10*3/uL (ref 0.0–0.1)
Basophils Relative: 0 %
Eosinophils Absolute: 0 10*3/uL (ref 0.0–0.5)
Eosinophils Relative: 0 %
HCT: 31.3 % — ABNORMAL LOW (ref 36.0–46.0)
Hemoglobin: 10 g/dL — ABNORMAL LOW (ref 12.0–15.0)
Immature Granulocytes: 0 %
Lymphocytes Relative: 6 %
Lymphs Abs: 0.7 10*3/uL (ref 0.7–4.0)
MCH: 25.5 pg — ABNORMAL LOW (ref 26.0–34.0)
MCHC: 31.9 g/dL (ref 30.0–36.0)
MCV: 79.8 fL — ABNORMAL LOW (ref 80.0–100.0)
Monocytes Absolute: 0.4 10*3/uL (ref 0.1–1.0)
Monocytes Relative: 4 %
Neutro Abs: 9.5 10*3/uL — ABNORMAL HIGH (ref 1.7–7.7)
Neutrophils Relative %: 90 %
Platelets: 413 10*3/uL — ABNORMAL HIGH (ref 150–400)
RBC: 3.92 MIL/uL (ref 3.87–5.11)
RDW: 16 % — ABNORMAL HIGH (ref 11.5–15.5)
WBC: 10.6 10*3/uL — ABNORMAL HIGH (ref 4.0–10.5)
nRBC: 0.3 % — ABNORMAL HIGH (ref 0.0–0.2)

## 2021-10-20 LAB — GLUCOSE, CAPILLARY: Glucose-Capillary: 118 mg/dL — ABNORMAL HIGH (ref 70–99)

## 2021-10-20 LAB — BLOOD GAS, ARTERIAL
Acid-base deficit: 9.4 mmol/L — ABNORMAL HIGH (ref 0.0–2.0)
Acid-base deficit: 10.3 mmol/L — ABNORMAL HIGH (ref 0.0–2.0)
Bicarbonate: 13.3 mmol/L — ABNORMAL LOW (ref 20.0–28.0)
Bicarbonate: 17.9 mmol/L — ABNORMAL LOW (ref 20.0–28.0)
O2 Saturation: 93.3 %
O2 Saturation: 92 %
Patient temperature: 39.4
Patient temperature: 39
pCO2 arterial: 22 mmHg — ABNORMAL LOW (ref 32–48)
pCO2 arterial: 53 mmHg — ABNORMAL HIGH (ref 32–48)
pH, Arterial: 7.39 (ref 7.35–7.45)
pH, Arterial: 7.14 — CL (ref 7.35–7.45)
pO2, Arterial: 78 mmHg — ABNORMAL LOW (ref 83–108)
pO2, Arterial: 92 mmHg (ref 83–108)

## 2021-10-20 LAB — PROCALCITONIN: Procalcitonin: 23.63 ng/mL

## 2021-10-20 LAB — MRSA NEXT GEN BY PCR, NASAL: MRSA by PCR Next Gen: NOT DETECTED

## 2021-10-20 LAB — RESP PANEL BY RT-PCR (FLU A&B, COVID) ARPGX2
Influenza A by PCR: NEGATIVE
Influenza B by PCR: NEGATIVE
SARS Coronavirus 2 by RT PCR: NEGATIVE

## 2021-10-20 LAB — PROTIME-INR
INR: 1.4 — ABNORMAL HIGH (ref 0.8–1.2)
Prothrombin Time: 16.6 seconds — ABNORMAL HIGH (ref 11.4–15.2)

## 2021-10-20 LAB — SEDIMENTATION RATE: Sed Rate: 3 mm/hr (ref 0–22)

## 2021-10-20 LAB — APTT: aPTT: 32 seconds (ref 24–36)

## 2021-10-20 MED ORDER — FENTANYL CITRATE PF 50 MCG/ML IJ SOSY
100.0000 ug | PREFILLED_SYRINGE | Freq: Once | INTRAMUSCULAR | Status: AC
  Administered 2021-10-20: 100 ug via INTRAVENOUS
  Filled 2021-10-20: qty 2

## 2021-10-20 MED ORDER — ORAL CARE MOUTH RINSE
15.0000 mL | OROMUCOSAL | Status: DC
  Administered 2021-10-21 (×6): 15 mL via OROMUCOSAL

## 2021-10-20 MED ORDER — MIDAZOLAM HCL 2 MG/2ML IJ SOLN
2.0000 mg | INTRAMUSCULAR | Status: DC | PRN
  Administered 2021-10-21: 2 mg via INTRAVENOUS
  Filled 2021-10-20 (×3): qty 2

## 2021-10-20 MED ORDER — STERILE WATER FOR INJECTION IV SOLN
INTRAVENOUS | Status: DC
  Filled 2021-10-20 (×3): qty 1000
  Filled 2021-10-20 (×3): qty 150

## 2021-10-20 MED ORDER — METRONIDAZOLE 500 MG/100ML IV SOLN
500.0000 mg | Freq: Once | INTRAVENOUS | Status: AC
  Administered 2021-10-20: 500 mg via INTRAVENOUS
  Filled 2021-10-20: qty 100

## 2021-10-20 MED ORDER — FENTANYL CITRATE (PF) 100 MCG/2ML IJ SOLN
INTRAMUSCULAR | Status: AC
  Filled 2021-10-20: qty 2

## 2021-10-20 MED ORDER — LACTATED RINGERS IV BOLUS (SEPSIS)
1000.0000 mL | Freq: Once | INTRAVENOUS | Status: AC
  Administered 2021-10-20: 1000 mL via INTRAVENOUS

## 2021-10-20 MED ORDER — SODIUM BICARBONATE 8.4 % IV SOLN
INTRAVENOUS | Status: AC
  Filled 2021-10-20: qty 100

## 2021-10-20 MED ORDER — PHENYLEPHRINE 80 MCG/ML (10ML) SYRINGE FOR IV PUSH (FOR BLOOD PRESSURE SUPPORT): 80.0000 ug | PREFILLED_SYRINGE | Freq: Once | INTRAVENOUS | Status: AC

## 2021-10-20 MED ORDER — POLYETHYLENE GLYCOL 3350 17 G PO PACK
17.0000 g | PACK | Freq: Every day | ORAL | Status: DC
  Administered 2021-10-20 – 2021-10-21 (×2): 17 g
  Filled 2021-10-20 (×2): qty 1

## 2021-10-20 MED ORDER — MIDAZOLAM HCL 2 MG/2ML IJ SOLN
INTRAMUSCULAR | Status: AC
  Filled 2021-10-20: qty 2

## 2021-10-20 MED ORDER — ADENOSINE 6 MG/2ML IV SOLN
INTRAVENOUS | Status: AC
  Filled 2021-10-20: qty 2

## 2021-10-20 MED ORDER — POTASSIUM CHLORIDE 10 MEQ/100ML IV SOLN
10.0000 meq | INTRAVENOUS | Status: AC
  Administered 2021-10-20 – 2021-10-21 (×4): 10 meq via INTRAVENOUS
  Filled 2021-10-20 (×4): qty 100

## 2021-10-20 MED ORDER — FENTANYL 2500MCG IN NS 250ML (10MCG/ML) PREMIX INFUSION
0.0000 ug/h | INTRAVENOUS | Status: DC
  Administered 2021-10-20: 50 ug/h via INTRAVENOUS
  Administered 2021-10-21: 300 ug/h via INTRAVENOUS
  Administered 2021-10-21: 400 ug/h via INTRAVENOUS
  Administered 2021-10-21: 350 ug/h via INTRAVENOUS
  Filled 2021-10-20 (×4): qty 250

## 2021-10-20 MED ORDER — SODIUM BICARBONATE 8.4 % IV SOLN
INTRAVENOUS | Status: DC
Start: 2021-10-20 — End: 2021-10-20
  Filled 2021-10-20: qty 50

## 2021-10-20 MED ORDER — DOCUSATE SODIUM 100 MG PO CAPS: 100.0000 mg | ORAL_CAPSULE | Freq: Two times a day (BID) | ORAL | Status: DC | PRN

## 2021-10-20 MED ORDER — FENTANYL CITRATE PF 50 MCG/ML IJ SOSY: 0.0000 ug | PREFILLED_SYRINGE | INTRAMUSCULAR | Status: DC | PRN

## 2021-10-20 MED ORDER — ACETAMINOPHEN 325 MG PO TABS
650.0000 mg | ORAL_TABLET | Freq: Once | ORAL | Status: AC
  Administered 2021-10-20: 650 mg via ORAL
  Filled 2021-10-20: qty 2

## 2021-10-20 MED ORDER — ROCURONIUM BROMIDE 10 MG/ML (PF) SYRINGE
PREFILLED_SYRINGE | INTRAVENOUS | Status: AC
  Filled 2021-10-20: qty 10

## 2021-10-20 MED ORDER — IPRATROPIUM BROMIDE 0.02 % IN SOLN
0.5000 mg | Freq: Two times a day (BID) | RESPIRATORY_TRACT | Status: DC
  Administered 2021-10-20 – 2021-10-21 (×2): 0.5 mg via RESPIRATORY_TRACT
  Filled 2021-10-20 (×3): qty 2.5

## 2021-10-20 MED ORDER — MIDAZOLAM HCL 2 MG/2ML IJ SOLN
2.0000 mg | INTRAMUSCULAR | Status: DC | PRN
  Administered 2021-10-20 – 2021-10-21 (×4): 2 mg via INTRAVENOUS
  Filled 2021-10-20 (×4): qty 2

## 2021-10-20 MED ORDER — PHENYLEPHRINE 80 MCG/ML (10ML) SYRINGE FOR IV PUSH (FOR BLOOD PRESSURE SUPPORT)
PREFILLED_SYRINGE | INTRAVENOUS | Status: AC
  Administered 2021-10-20: 80 ug via INTRAVENOUS
  Filled 2021-10-20: qty 10

## 2021-10-20 MED ORDER — HEPARIN SODIUM (PORCINE) 5000 UNIT/ML IJ SOLN
5000.0000 [IU] | Freq: Three times a day (TID) | INTRAMUSCULAR | Status: DC
Start: 2021-10-20 — End: 2021-10-21
  Administered 2021-10-20 – 2021-10-21 (×2): 5000 [IU] via SUBCUTANEOUS
  Filled 2021-10-20 (×2): qty 1

## 2021-10-20 MED ORDER — LACTATED RINGERS IV BOLUS
2000.0000 mL | Freq: Once | INTRAVENOUS | Status: AC
  Administered 2021-10-20: 2000 mL via INTRAVENOUS

## 2021-10-20 MED ORDER — FENTANYL BOLUS VIA INFUSION
50.0000 ug | INTRAVENOUS | Status: DC | PRN
  Administered 2021-10-20 – 2021-10-21 (×2): 50 ug via INTRAVENOUS
  Administered 2021-10-21 (×2): 100 ug via INTRAVENOUS
  Administered 2021-10-21: 50 ug via INTRAVENOUS
  Administered 2021-10-21 (×4): 100 ug via INTRAVENOUS

## 2021-10-20 MED ORDER — ARFORMOTEROL TARTRATE 15 MCG/2ML IN NEBU
15.0000 ug | INHALATION_SOLUTION | Freq: Two times a day (BID) | RESPIRATORY_TRACT | Status: DC
  Administered 2021-10-20 – 2021-10-21 (×2): 15 ug via RESPIRATORY_TRACT
  Filled 2021-10-20 (×4): qty 2

## 2021-10-20 MED ORDER — METRONIDAZOLE 500 MG/100ML IV SOLN
500.0000 mg | Freq: Two times a day (BID) | INTRAVENOUS | Status: DC
  Administered 2021-10-21: 500 mg via INTRAVENOUS
  Filled 2021-10-20: qty 100

## 2021-10-20 MED ORDER — FENTANYL CITRATE PF 50 MCG/ML IJ SOSY
PREFILLED_SYRINGE | INTRAMUSCULAR | Status: AC
  Filled 2021-10-20: qty 2

## 2021-10-20 MED ORDER — POTASSIUM CHLORIDE 10 MEQ/100ML IV SOLN
10.0000 meq | INTRAVENOUS | Status: DC
  Filled 2021-10-20: qty 100

## 2021-10-20 MED ORDER — SODIUM CHLORIDE 0.9 % IV SOLN
2.0000 g | Freq: Two times a day (BID) | INTRAVENOUS | Status: DC
  Administered 2021-10-21 (×2): 2 g via INTRAVENOUS
  Filled 2021-10-20 (×2): qty 12.5

## 2021-10-20 MED ORDER — VANCOMYCIN HCL IN DEXTROSE 1-5 GM/200ML-% IV SOLN
1000.0000 mg | Freq: Once | INTRAVENOUS | Status: AC
  Administered 2021-10-20: 1000 mg via INTRAVENOUS
  Filled 2021-10-20: qty 200

## 2021-10-20 MED ORDER — LACTATED RINGERS IV BOLUS (SEPSIS)
250.0000 mL | Freq: Once | INTRAVENOUS | Status: AC
Start: 2021-10-20 — End: 2021-10-20
  Administered 2021-10-20: 1000 mL via INTRAVENOUS

## 2021-10-20 MED ORDER — VANCOMYCIN HCL 500 MG/100ML IV SOLN
500.0000 mg | Freq: Two times a day (BID) | INTRAVENOUS | Status: DC
  Administered 2021-10-21: 500 mg via INTRAVENOUS
  Filled 2021-10-20 (×3): qty 100

## 2021-10-20 MED ORDER — SODIUM BICARBONATE 8.4 % IV SOLN
100.0000 meq | Freq: Once | INTRAVENOUS | Status: AC
  Administered 2021-10-20: 100 meq via INTRAVENOUS
  Filled 2021-10-20: qty 50

## 2021-10-20 MED ORDER — KETAMINE HCL 50 MG/5ML IJ SOSY
PREFILLED_SYRINGE | INTRAMUSCULAR | Status: AC
  Filled 2021-10-20: qty 5

## 2021-10-20 MED ORDER — DOCUSATE SODIUM 50 MG/5ML PO LIQD
100.0000 mg | Freq: Two times a day (BID) | ORAL | Status: DC
  Administered 2021-10-20 – 2021-10-21 (×2): 100 mg
  Filled 2021-10-20 (×2): qty 10

## 2021-10-20 MED ORDER — PANTOPRAZOLE SODIUM 40 MG IV SOLR: 40.0000 mg | Freq: Every day | INTRAVENOUS | Status: DC | PRN

## 2021-10-20 MED ORDER — ETOMIDATE 2 MG/ML IV SOLN
20.0000 mg | Freq: Once | INTRAVENOUS | Status: AC
  Administered 2021-10-20: 20 mg via INTRAVENOUS

## 2021-10-20 MED ORDER — HYDROMORPHONE HCL 1 MG/ML IJ SOLN
0.5000 mg | INTRAMUSCULAR | Status: DC | PRN
  Administered 2021-10-20: 1 mg via INTRAVENOUS
  Filled 2021-10-20: qty 1

## 2021-10-20 MED ORDER — POTASSIUM CHLORIDE CRYS ER 20 MEQ PO TBCR: 40.0000 meq | EXTENDED_RELEASE_TABLET | Freq: Once | ORAL | Status: DC

## 2021-10-20 MED ORDER — SODIUM BICARBONATE 8.4 % IV SOLN
100.0000 meq | Freq: Once | INTRAVENOUS | Status: AC
  Administered 2021-10-20: 100 meq via INTRAVENOUS

## 2021-10-20 MED ORDER — SODIUM CHLORIDE 0.9 % IV BOLUS: 1000.0000 mL | Freq: Once | INTRAVENOUS | Status: DC

## 2021-10-20 MED ORDER — SUCCINYLCHOLINE CHLORIDE 200 MG/10ML IV SOSY
PREFILLED_SYRINGE | INTRAVENOUS | Status: AC
  Filled 2021-10-20: qty 10

## 2021-10-20 MED ORDER — VANCOMYCIN HCL 750 MG/150ML IV SOLN: 750.0000 mg | INTRAVENOUS | Status: DC

## 2021-10-20 MED ORDER — POLYETHYLENE GLYCOL 3350 17 G PO PACK: 17.0000 g | PACK | Freq: Every day | ORAL | Status: DC | PRN

## 2021-10-20 MED ORDER — LACTATED RINGERS IV SOLN: INTRAVENOUS | Status: DC

## 2021-10-20 MED ORDER — ORAL CARE MOUTH RINSE: 15.0000 mL | OROMUCOSAL | Status: DC | PRN

## 2021-10-20 MED ORDER — SODIUM CHLORIDE 0.9 % IV SOLN
2.0000 g | Freq: Once | INTRAVENOUS | Status: AC
  Administered 2021-10-20: 2 g via INTRAVENOUS
  Filled 2021-10-20: qty 12.5

## 2021-10-20 MED ORDER — CHLORHEXIDINE GLUCONATE CLOTH 2 % EX PADS
6.0000 | MEDICATED_PAD | Freq: Every day | CUTANEOUS | Status: DC
  Administered 2021-10-21: 6 via TOPICAL

## 2021-10-20 MED ORDER — PANTOPRAZOLE 2 MG/ML SUSPENSION
40.0000 mg | Freq: Every day | ORAL | Status: DC
  Administered 2021-10-20 – 2021-10-21 (×2): 40 mg
  Filled 2021-10-20 (×2): qty 20

## 2021-10-20 MED ORDER — FENTANYL CITRATE (PF) 100 MCG/2ML IJ SOLN
100.0000 ug | Freq: Once | INTRAMUSCULAR | Status: AC
  Administered 2021-10-20: 100 ug via INTRAVENOUS

## 2021-10-20 MED ORDER — GADOBUTROL 1 MMOL/ML IV SOLN
6.0000 mL | Freq: Once | INTRAVENOUS | Status: AC | PRN
Start: 2021-10-20 — End: 2021-10-20
  Administered 2021-10-20: 6 mL via INTRAVENOUS

## 2021-10-20 MED ORDER — FENTANYL CITRATE PF 50 MCG/ML IJ SOSY: 50.0000 ug | PREFILLED_SYRINGE | Freq: Once | INTRAMUSCULAR | Status: DC

## 2021-10-20 MED ORDER — ETOMIDATE 2 MG/ML IV SOLN
INTRAVENOUS | Status: AC
  Filled 2021-10-20: qty 20

## 2021-10-20 MED ORDER — BUDESONIDE 0.5 MG/2ML IN SUSP
0.5000 mg | Freq: Two times a day (BID) | RESPIRATORY_TRACT | Status: DC
  Administered 2021-10-20 – 2021-10-21 (×2): 0.5 mg via RESPIRATORY_TRACT
  Filled 2021-10-20 (×3): qty 2

## 2021-10-20 MED ORDER — ROCURONIUM BROMIDE 50 MG/5ML IV SOLN
100.0000 mg | Freq: Once | INTRAVENOUS | Status: AC
  Administered 2021-10-20: 100 mg via INTRAVENOUS

## 2021-10-20 NOTE — Progress Notes (Signed)
Pharmacy Antibiotic Note  Andrea Houston is a 66 y.o. female admitted on 10-31-21 with  back pain .  PMH significant for PLIF L4-L5 on 08/28/21. Pt began experiencing back pain on 7/3, CT lumbar spine shows scattered small locules of gas density in the spinal canal at L3-4, in the left neural foramen at L4-5, along the right facetectomy site at L4-5, and in the soft tissues just to the right of the L4 spinous process tip. Concern for paraspinal infection, broad spectrum antibiotics being initiated.   Pharmacy has been consulted for cefepime and vancomycin dosing.  Today, 2021/10/31 WBC slightly elevated SCr WNL. CrCl 50 mL/min Lactate elevated Febrile, Tmax 103.1  Due to concern for possible CNS involvement, will dose vancomycin more aggressively using nomogram to target VT of 15-20 mcg/mL instead of AUC targeted dosing.  Plan: Cefepime 2 g IV q12h Vancomycin 1000 mg IV LD given, will order maintenance dose of 500 mg IV q12h  Goal vancomycin trough 15-20 mcg/mL. Check levels at steady state as needed.  Follow renal function and culture data  Height: 5' (152.4 cm) Weight: 61.2 kg (135 lb) IBW/kg (Calculated) : 45.5  Temp (24hrs), Avg:103.1 F (39.5 C), Min:103.1 F (39.5 C), Max:103.1 F (39.5 C)  Recent Labs  Lab 31-Oct-2021 1431  WBC 10.6*  CREATININE 0.92  LATICACIDVEN 8.3*    Estimated Creatinine Clearance: 50.5 mL/min (by C-G formula based on SCr of 0.92 mg/dL).    Allergies  Allergen Reactions   Codeine Nausea And Vomiting and Nausea Only    Patient tolerates with a anti-nausea medication.    Antimicrobials this admission: vancomycin 7/7 >>  cefepime 7/7 >>   Dose adjustments this admission:  Microbiology results: 7/7 BCx:  7/7 UCx:     Cindi Carbon, PharmD 10/31/21 6:35 PM

## 2021-10-20 NOTE — ED Notes (Signed)
Patient transported to CT 

## 2021-10-20 NOTE — Progress Notes (Signed)
A consult was received from an ED physician for cefepime and vancomycin per pharmacy dosing.  The patient's profile has been reviewed for ht/wt/allergies/indication/available labs.    Ht/wt order entered. No weight documented for weight based vancomycin loading dose.  A one time order has been placed for cefepime 2 g + vancomycin 1000 mg IV once.    Further antibiotics/pharmacy consults should be ordered by admitting physician if indicated.                       Thank you, Cindi Carbon, PharmD 10/28/2021  2:48 PM

## 2021-10-20 NOTE — Progress Notes (Signed)
Patient taken from 1239 to MRI on the vent without any complications. Pt back in her room.

## 2021-10-20 NOTE — Sepsis Progress Note (Signed)
Notified bedside nurse of need to draw repeat lactic acid. 

## 2021-10-20 NOTE — ED Triage Notes (Signed)
Pt diverted from [facility], brought in by EMS due to lower back pain. Surgical procedure preformed at [facility] on 08-28-2021. Pain in lower back became reportedly unbearable throughout the night. Manages pain with oxycodone. Pt has not taken anything for pain today.   BP 106/59 P 75 spO2 98% T 100.6

## 2021-10-20 NOTE — H&P (Signed)
NAME:  Andrea Houston, MRN:  [id number], DOB:  12/20/1955, LOS: 0 ADMISSION DATE:  10/23/2021, CONSULTATION DATE:  10/17/2021 REFERRING MD:  Jeanell Sparrow CHIEF COMPLAINT:  Back pain   History of Present Illness:  Andrea Houston is a 66 y.o. female who has a PMH as below. She had PLIF L4-L5 on 08/28/21 (Dr. Ronnald Ramp) and surgery went well.  She was discharged home 08/30/21 and had been doing well post operatively. On Monday 7/3, she began to experience sharp back pain at the surgical site. Pain persisted and worsened with radiation into her legs. She then ended up falling due to "legs giving out". She tried to use pain meds at home but had minimal relief.  Due to no changes, her husband called 911 on 7/7. She was subsequently brought to [facility] ED where CT of the lumbar spine showed scattered small gas densities in the spinal canal at L3-4, L4-L5 area. Neurosurgery was consulted and requested MRI lumbar spine before further recs. She has had no drainage from surgical site, states it has healed well. She has had 1 follow up with Dr. Ronnald Ramp and everything checked out fine. She has had subjective fevers and chills for the past week or so. No fecal or urinary incontinence but has increased urgency and "when I have to go, I have to go NOW". Has had tingling in bilateral legs and feet. She has had decreased PO intake since 7/3 due to pain and "not wanting to do anything".  She had stable BP but HR fluctuating as high as 150's. She is writhing in pain. RR up to 25-30 and she is requesting pain meds. Oral mucosa extremely dry, has significant skin tenting. She has thus far received 2L LR as well as 2g Cefepime and 1g Vancomycin.  PCCM called for admission to ICU for ongoing management.  Pertinent  Medical History:  has S/P lumbar fusion and Sepsis ([place]) on their problem list.  Significant Hospital Events: Including procedures, antibiotic start and stop dates in addition to other pertinent events   7/7 admit  Interim History  / Subjective:  Writhing in pain. HR 140's, RR 25-30s.  Objective:  Blood pressure (!) 119/101, pulse (!) 122, temperature (!) 103.1 F (39.5 C), resp. rate (!) 27, height 5' (1.524 m), weight 61.2 kg, SpO2 (!) 89 %.        Intake/Output Summary (Last 24 hours) at 11/13/2021 1750 Last data filed at 11/03/2021 1748 Gross per 24 hour  Intake 2200 ml  Output --  Net 2200 ml   Filed Weights   10/29/2021 1736  Weight: 61.2 kg    Examination: General: Adult female, uncomfortable in ED stretcher. Neuro: A&O x 3. Weakness to LE's due to pain but she is able to move both independently. HEENT: [place]/AT. Sclerae anicteric. MM very dry. Cardiovascular: Tachy, regular, no M/R/G.  Lungs: Respirations shallow and rapid.  CTA bilaterally, No W/R/R. Abdomen: BS x 4, soft, NT/ND.  Musculoskeletal: No gross deformities, no edema.  Skin: Intact, warm, no rashes.  Labs/imaging personally reviewed:  CT L spine 7/7 > scattered gas densities in the spinal canal at L3-4, at L4, in the left neural foramen at L4-5, along the right facetectomy site at L4-5, and in the soft tissues just to the right of the L4 spinous process tip. Severe abdominal aortic atherosclerosis with marked severe stenosis of distal abdominal aorta. 5.6cm fluid density above bladder favored to be left ovarian cyst. MRI L spine 7/7 >   Assessment & Plan:  Possible sepsis - ? Post op back infection.  While she does meet SIRS criteria, some of these are exacerbated by pain and dehydration with hypovolemia. - Additional 2L LR bolus now (total of 4) then maintenance fluids. - Empiric Vanc and Cefepime. - F/u on blood cultures. - Repeat lactic acid. - Check PCT.  Tachypnea and tachycardia - felt primarily 2/2 back pain. - Low dose fentanyl for now. - If no improvement, can switch to Dilaudid.  Scattered gas densities at L3-L5 - s/p PLIF L4-L5 on 08/28/21. - Neurosurgery consulted by EDP, requesting MRI L spine which pt is getting  now. - NSGY to f/u on MRI and decide on next steps.  Hypokalemia. - 40 mEq K x 1. - Follow BMP.  Hx CAD s/p stenting x 3 2012, HTN. - Hold home Clopidogrel for now until decision on whether any surgical intervention is warranted. - Hold home Clonidine.  Hx COPD. - Budesonide/Brovana/Atrovent in lieu of home Trelegy.  Hx Anxiety. - Hold home Lorazepam, Paroxetine, Temazepam.  Severe abdominal aortic atherosclerosis. - F/u with PCP, recommend lipid panel and further workup. - Consider establishing with vascular as well as cardiology (? Cardiac CT) given extent of atherosclerosis with stenosis of distal abdominal aorta.  ? Ovarian cyst - 5.6cm fluid density above bladder on CT L spine. - F/u as outpatient for pelvic US.  Best practice (evaluated daily):  Diet/type: NPO DVT prophylaxis: prophylactic heparin  GI prophylaxis: N/A Lines: N/A Foley:  N/A Code Status:  full code Last date of multidisciplinary goals of care discussion: None yet.  Labs   CBC: Recent Labs  Lab 2021-11-11 1431  WBC 10.6*  NEUTROABS 9.5*  HGB 10.0*  HCT 31.3*  MCV 79.8*  PLT 413*    Basic Metabolic Panel: Recent Labs  Lab 2021-11-11 1431  NA 138  K 3.1*  CL 107  CO2 17*  GLUCOSE 86  BUN 9  CREATININE 0.92  CALCIUM 9.7   GFR: Estimated Creatinine Clearance: 50.5 mL/min (by C-G formula based on SCr of 0.92 mg/dL). Recent Labs  Lab 2021/11/11 1431  WBC 10.6*  LATICACIDVEN 8.3*    Liver Function Tests: Recent Labs  Lab November 11, 2021 1431  AST 166*  ALT 37  ALKPHOS 91  BILITOT 0.9  PROT 6.7  ALBUMIN 2.6*   No results for input(s): "LIPASE", "AMYLASE" in the last 168 hours. No results for input(s): "AMMONIA" in the last 168 hours.  ABG    Component Value Date/Time   PHART 7.39 Nov 11, 2021 1700   PCO2ART 22 (L) 11-11-21 1700   PO2ART 78 (L) 11-11-2021 1700   HCO3 13.3 (L) 11/11/21 1700   ACIDBASEDEF 9.4 (H) 11-Nov-2021 1700   O2SAT 93.3 November 11, 2021 1700     Coagulation  Profile: Recent Labs  Lab 11-Nov-2021 1431  INR 1.4*    Cardiac Enzymes: No results for input(s): "CKTOTAL", "CKMB", "CKMBINDEX", "TROPONINI" in the last 168 hours.  HbA1C: No results found for: "HGBA1C"  CBG: No results for input(s): "GLUCAP" in the last 168 hours.  Review of Systems:   All negative; except for those that are bolded, which indicate positives.  Constitutional: weight loss, weight gain, night sweats, fevers, chills, fatigue, weakness.  HEENT: headaches, sore throat, sneezing, nasal congestion, post nasal drip, difficulty swallowing, tooth/dental problems, visual complaints, visual changes, ear aches. Neuro: difficulty with speech, weakness, numbness, ataxia. CV:  chest pain, orthopnea, PND, swelling in lower extremities, dizziness, palpitations, syncope.  Resp: cough, hemoptysis, dyspnea, wheezing. GI: heartburn, indigestion, abdominal pain, nausea, vomiting,  diarrhea, constipation, change in bowel habits, loss of appetite, hematemesis, melena, hematochezia.  GU: dysuria, change in color of urine, urgency or frequency, flank pain, hematuria. MSK: joint pain or swelling, decreased range of motion, back pain, leg pain, leg tingling. Psych: change in mood or affect, depression, anxiety, suicidal ideations, homicidal ideations. Skin: rash, itching, bruising.   Past Medical History:  She,  has a past medical history of Anxiety, CAD (coronary artery disease), COPD (chronic obstructive pulmonary disease) ([place]), Renal insufficiency, SBO (small bowel obstruction) ([place]) (03/08/2016), Sleep apnea, Uric acid nephrolithiasis (04/24/2016), and Urinary tract infection (04/24/2016).   Surgical History:   Past Surgical History:  Procedure Laterality Date   APPENDECTOMY     CHOLECYSTECTOMY     CORONARY ANGIOPLASTY  09/2010   with stent placement   PR cysto/uretero w/lithotripsy     indwelling stent insertion, stents have since been removed     Social History:   reports that  she quit smoking about 3 months ago. Her smoking use included cigarettes. She has never used smokeless tobacco. She reports that she does not currently use alcohol. She reports that she does not use drugs.   Family History:  Her family history is not on file.   Allergies Allergies  Allergen Reactions   Codeine Nausea And Vomiting and Nausea Only    Patient tolerates with a anti-nausea medication.     Home Medications  Prior to Admission medications   Medication Sig Start Date End Date Taking? Authorizing Provider  Cholecalciferol (VITAMIN D3 PO) Take 1 tablet by mouth every evening.    [provider]  Choline Fenofibrate (FENOFIBRIC ACID) 135 MG CPDR Take 135 mg by mouth at bedtime. 05/30/21   [provider]  cloNIDine (CATAPRES) 0.1 MG tablet Take 0.1 mg by mouth at bedtime. 08/14/21   [provider]  clopidogrel (PLAVIX) 75 MG tablet Take 75 mg by mouth daily. 05/18/21   [provider]  cyclobenzaprine (FLEXERIL) 5 MG tablet Take 5 mg by mouth 2 (two) times daily. 07/22/21   [provider]  ezetimibe (ZETIA) 10 MG tablet Take 10 mg by mouth in the morning.    [provider]  furosemide (LASIX) 40 MG tablet Take 40 mg by mouth See admin instructions. Take 1 tablet (40 mg) by mouth scheduled every morning & may take an additional dose in the afternoon if still swollen. 06/29/21   [provider]  HYDROcodone-acetaminophen (NORCO/VICODIN) 5-325 MG tablet Take 1 tablet by mouth 2 (two) times daily as needed. 03/28/21   [provider]  ipratropium-albuterol (DUONEB) 0.5-2.5 (3) MG/3ML SOLN Take 3 mLs by nebulization every 6 (six) hours as needed (wheezing/shortness of breath).    [provider]  Javier Docker Oil 500 MG CAPS Take 500 mg by mouth in the morning.    [provider]  LORazepam (ATIVAN) 0.5 MG tablet Take 0.5 mg by mouth 3 (three) times daily as needed for anxiety. 08/14/21   [provider]   methocarbamol (ROBAXIN) 500 MG tablet Take 1 tablet (500 mg total) by mouth 2 (two) times daily. 04/10/21   Evlyn Courier, PA-C  methocarbamol (ROBAXIN-750) 750 MG tablet Take 1 tablet (750 mg total) by mouth 4 (four) times daily. 08/30/21   Meyran, Ocie Cornfield, NP  metoprolol succinate (TOPROL-XL) 50 MG 24 hr tablet Take 50 mg by mouth in the morning. 06/28/21   [provider]  nitrofurantoin, macrocrystal-monohydrate, (MACROBID) 100 MG capsule Take 100 mg by mouth in the morning. 08/15/21  [provider]  nystatin (MYCOSTATIN) 100000 UNIT/ML suspension Take 10-20 mLs by mouth 2 (two) times daily as needed (oral thrush). 05/16/21   [provider]  ondansetron (ZOFRAN) 4 MG tablet Take 4 mg by mouth every 8 (eight) hours as needed for nausea. 07/06/21   [provider]  oxyCODONE-acetaminophen (PERCOCET) 5-325 MG tablet Take 1 tablet by mouth every 4 (four) hours as needed for severe pain. 08/30/21 08/30/22  Meyran, Tiana Loft, NP  oxyCODONE-acetaminophen (PERCOCET/ROXICET) 5-325 MG tablet Take 0.5-1 tablets by mouth every 6 (six) hours as needed for severe pain.    [provider]  pantoprazole (PROTONIX) 40 MG tablet Take 40 mg by mouth in the morning. 06/12/21   [provider]  PARoxetine (PAXIL) 20 MG tablet Take 20 mg by mouth every evening. 08/21/21   [provider]  PARoxetine (PAXIL) 40 MG tablet Take 40 mg by mouth in the morning. 08/14/21   [provider]  POTASSIUM PO Take 1 tablet by mouth in the morning.    [provider]  promethazine-dextromethorphan (PROMETHAZINE-DM) 6.25-15 MG/5ML syrup Take 5 mLs by mouth every 4 (four) hours as needed. 05/26/21   [provider]  rosuvastatin (CRESTOR) 40 MG tablet Take 40 mg by mouth at bedtime. 05/30/21   [provider]  temazepam (RESTORIL) 30 MG capsule Take 30 mg by mouth at bedtime. 05/29/21   [provider]  TRELEGY ELLIPTA  100-62.5-25 MCG/ACT AEPB Inhale 1 puff into the lungs every other day. 02/28/21   [provider]  VENTOLIN HFA 108 (90 Base) MCG/ACT inhaler Inhale 1-2 puffs into the lungs every 6 (six) hours as needed for wheezing or shortness of breath. 02/28/21   [provider]     Critical care time: 40 min.   Rutherford Guys, PA - [facility] For pager details, please see AMION or use Epic chat  After 1900, please call [facility] for cross coverage needs 10/15/2021, 5:50 PM

## 2021-10-20 NOTE — ED Notes (Signed)
Writer attempted to place foley cath at bedside with no success. After leaving the room to notify spouse, he stated, "Im not surprised, she is difficult." Foley cath team notified.

## 2021-10-20 NOTE — Progress Notes (Addendum)
Cross covering icu physician  Called by Pola Corn to assess at bedside.  Pt is intubated sedated, BP stable but tachycardic. Profound hypoxia on 100% and 5 peep so have increased peep at this time.  ELINk had previously increased RR 2/2 rising co2 and acidosis with pH 7.1 but also lactate >9.   -Bicarb <20 so will start infusion after 2 amps.  -change LR to 188ml/hr and increased bicarb to -would obtain echo -perhaps cta pulmonary for ? PE in this setting, per chart review pt presented with normal oxygen levels but desat around 1550 requiring oxygen supplementation, but did not receive fentanyl until 1746 this exacerbated her hypoxia but even still with intubation she remains low.  -will check LE venous duplex (may warrant arterial as well in setting of mottling, reportedly presented this way) -as well as mesenteric duplex to eval for mesenteric ischemia in setting of lactate >9 with severe aortic sclerosis noted distally with known vascular disease  ELINK to cont f/u on labs and other daytime recs RN updated at bedside  Additional 35 min of critical care time performed from 2200-2300 excluding procedures.

## 2021-10-20 NOTE — Progress Notes (Signed)
Notified Lab that ABG being sent for analysis. 

## 2021-10-20 NOTE — Progress Notes (Signed)
PCCM Interval Progress Note  Received 100 Fentanyl in ED. Arrived to ICU minutes later and noted to be much more altered then in ED, blank stare, skin dusky, HR up to 160's. EKG obtained and confirmed sinus tach. Pt tachypneic to 30's, shallow respirations, minimally responsive.  Discussed with husband safest bet is elective intubation now then proceed with MRI and have a better understanding of things to help figure out next steps. Husband in agreement.   Dr. Celine Mans proceeding with intubation now. Vent and PAD orders placed. PCT and repeat lactic acid pending still - ELINK to follow.   Rutherford Guys, PA - [facility] [place] [facility] For pager details, please see AMION or use Epic chat  After 1900, please call [facility] for cross coverage needs 11/13/2021, 6:59 PM

## 2021-10-20 NOTE — Progress Notes (Signed)
ABG collected and send down to lab for analysis. Lab notified.  

## 2021-10-20 NOTE — ED Provider Notes (Signed)
[facility]-EMERGENCY DEPT Provider Note   CSN: [id number] Arrival date & time: 11/12/2021  1343     History  Chief Complaint  Patient presents with   Code Sepsis   Back Pain    Andrea Houston is a 66 y.o. female.  HPI 66 year old female history of COPD, UTI, CAD, s/p back surgery 08/28/2021 presents today from home with increased back pain, weakness, loss of bladder control, and fever.  Patient appears somewhat confused and history is limited secondary to this.  She states she has not been able to walk for several days.  She reports that she fell after her the pain worsened.  She reports initially having good recovery from her surgery.  Husband is present at the bedside but states that he goes to work and does not know what has had happening each day.    Home Medications Prior to Admission medications   Medication Sig Start Date End Date Taking? Authorizing Provider  Cholecalciferol (VITAMIN D3 PO) Take 1 tablet by mouth every evening.    [provider]  Choline Fenofibrate (FENOFIBRIC ACID) 135 MG CPDR Take 135 mg by mouth at bedtime. 05/30/21   [provider]  cloNIDine (CATAPRES) 0.1 MG tablet Take 0.1 mg by mouth at bedtime. 08/14/21   [provider]  clopidogrel (PLAVIX) 75 MG tablet Take 75 mg by mouth daily. 05/18/21   [provider]  cyclobenzaprine (FLEXERIL) 5 MG tablet Take 5 mg by mouth 2 (two) times daily. 07/22/21   [provider]  ezetimibe (ZETIA) 10 MG tablet Take 10 mg by mouth in the morning.    [provider]  furosemide (LASIX) 40 MG tablet Take 40 mg by mouth See admin instructions. Take 1 tablet (40 mg) by mouth scheduled every morning & may take an additional dose in the afternoon if still swollen. 06/29/21   [provider]  HYDROcodone-acetaminophen (NORCO/VICODIN) 5-325 MG tablet Take 1 tablet by mouth 2 (two) times daily as needed. 03/28/21   [provider]   ipratropium-albuterol (DUONEB) 0.5-2.5 (3) MG/3ML SOLN Take 3 mLs by nebulization every 6 (six) hours as needed (wheezing/shortness of breath).    [provider]  Boris Lown Oil 500 MG CAPS Take 500 mg by mouth in the morning.    [provider]  LORazepam (ATIVAN) 0.5 MG tablet Take 0.5 mg by mouth 3 (three) times daily as needed for anxiety. 08/14/21   [provider]  methocarbamol (ROBAXIN) 500 MG tablet Take 1 tablet (500 mg total) by mouth 2 (two) times daily. 04/10/21   Marita Kansas, PA-C  methocarbamol (ROBAXIN-750) 750 MG tablet Take 1 tablet (750 mg total) by mouth 4 (four) times daily. 08/30/21   Meyran, Tiana Loft, NP  metoprolol succinate (TOPROL-XL) 50 MG 24 hr tablet Take 50 mg by mouth in the morning. 06/28/21   [provider]  nitrofurantoin, macrocrystal-monohydrate, (MACROBID) 100 MG capsule Take 100 mg by mouth in the morning. 08/15/21   [provider]  nystatin (MYCOSTATIN) 100000 UNIT/ML suspension Take 10-20 mLs by mouth 2 (two) times daily as needed (oral thrush). 05/16/21   [provider]  ondansetron (ZOFRAN) 4 MG tablet Take 4 mg by mouth every 8 (eight) hours as needed for nausea. 07/06/21   [provider]  oxyCODONE-acetaminophen (PERCOCET) 5-325 MG tablet Take 1 tablet by mouth every 4 (four) hours as needed for severe pain. 08/30/21 08/30/22  Meyran, Tiana Loft, NP  oxyCODONE-acetaminophen (PERCOCET/ROXICET) 5-325 MG tablet Take 0.5-1  tablets by mouth every 6 (six) hours as needed for severe pain.    [provider]  pantoprazole (PROTONIX) 40 MG tablet Take 40 mg by mouth in the morning. 06/12/21   [provider]  PARoxetine (PAXIL) 20 MG tablet Take 20 mg by mouth every evening. 08/21/21   [provider]  PARoxetine (PAXIL) 40 MG tablet Take 40 mg by mouth in the morning. 08/14/21   [provider]  POTASSIUM PO Take 1 tablet by mouth in the morning.    [provider]  promethazine-dextromethorphan (PROMETHAZINE-DM) 6.25-15 MG/5ML syrup Take 5 mLs by mouth every 4 (four) hours as needed. 05/26/21   [provider]  rosuvastatin (CRESTOR) 40 MG tablet Take 40 mg by mouth at bedtime. 05/30/21   [provider]  temazepam (RESTORIL) 30 MG capsule Take 30 mg by mouth at bedtime. 05/29/21   [provider]  TRELEGY ELLIPTA 100-62.5-25 MCG/ACT AEPB Inhale 1 puff into the lungs every other day. 02/28/21   [provider]  VENTOLIN HFA 108 (90 Base) MCG/ACT inhaler Inhale 1-2 puffs into the lungs every 6 (six) hours as needed for wheezing or shortness of breath. 02/28/21   [provider]      Allergies    Codeine    Review of Systems   Review of Systems  Physical Exam Updated Vital Signs BP 124/81   Pulse (!) 145   Temp (!) 103.1 F (39.5 C)   Resp (!) 32   SpO2 91%  Physical Exam Vitals and nursing note reviewed.  Constitutional:      General: She is in acute distress.     Appearance: Normal appearance. She is normal weight. She is ill-appearing.     Comments: Oral pressure checked and 103  HENT:     Head: Normocephalic and atraumatic.     Right Ear: External ear normal.     Left Ear: External ear normal.     Nose: Nose normal.     Mouth/Throat:     Mouth: Mucous membranes are dry.  Eyes:     Pupils: Pupils are equal, round, and reactive to light.  Cardiovascular:     Rate and Rhythm: Regular rhythm. Tachycardia present.     Pulses: Normal pulses.     Heart sounds: Normal heart sounds.  Pulmonary:     Effort: Pulmonary effort is normal.     Breath sounds: Normal breath sounds.  Abdominal:     Palpations: Abdomen is soft.  Musculoskeletal:        General: Normal range of motion.     Cervical back: Normal range of motion.  Skin:    General: Skin is warm.     Capillary Refill: Capillary refill takes less than 2 seconds.  Neurological:     Mental Status: She is alert.     Comments: Poor  effort given although patient is spontaneously moving her lower extremities and has some facial twitching Upper extremities are moving equally     ED Results / Procedures / Treatments   Labs (all labs ordered are listed, but only abnormal results are displayed) Labs Reviewed  LACTIC ACID, PLASMA - Abnormal; Notable for the following components:      Result Value   Lactic Acid, Venous 8.3 (*)    All other components within normal limits  COMPREHENSIVE METABOLIC PANEL - Abnormal; Notable for the following components:   Potassium 3.1 (*)    CO2 17 (*)    Albumin 2.6 (*)  AST 166 (*)    All other components within normal limits  CBC WITH DIFFERENTIAL/PLATELET - Abnormal; Notable for the following components:   WBC 10.6 (*)    Hemoglobin 10.0 (*)    HCT 31.3 (*)    MCV 79.8 (*)    MCH 25.5 (*)    RDW 16.0 (*)    Platelets 413 (*)    nRBC 0.3 (*)    Neutro Abs 9.5 (*)    All other components within normal limits  PROTIME-INR - Abnormal; Notable for the following components:   Prothrombin Time 16.6 (*)    INR 1.4 (*)    All other components within normal limits  BLOOD GAS, ARTERIAL - Abnormal; Notable for the following components:   pCO2 arterial 22 (*)    pO2, Arterial 78 (*)    Bicarbonate 13.3 (*)    Acid-base deficit 9.4 (*)    All other components within normal limits  RESP PANEL BY RT-PCR (FLU A&B, COVID) ARPGX2  CULTURE, BLOOD (ROUTINE X 2)  CULTURE, BLOOD (ROUTINE X 2)  URINE CULTURE  APTT  LACTIC ACID, PLASMA  URINALYSIS, ROUTINE W REFLEX MICROSCOPIC  SEDIMENTATION RATE  C-REACTIVE PROTEIN    EKG EKG Interpretation  Date/Time:  Friday Oct 27, 2021 13:57:02 EDT Ventricular Rate:  130 PR Interval:  156 QRS Duration: 85 QT Interval:  291 QTC Calculation: 428 R Axis:   32 Text Interpretation: Sinus tachycardia Multiform ventricular premature complexes Low voltage, precordial leads Borderline repolarization abnormality Confirmed by Margarita Grizzle [phone]) on  Oct 27, 2021 3:14:32 PM  Radiology CT Lumbar Spine Wo Contrast  Result Date: 2021-10-27 CLINICAL DATA:  Low back pain.  L5-S1 fusion on 08/28/2021 EXAM: CT LUMBAR SPINE WITHOUT CONTRAST TECHNIQUE: Multidetector CT imaging of the lumbar spine was performed without intravenous contrast administration. Multiplanar CT image reconstructions were also generated. RADIATION DOSE REDUCTION: This exam was performed according to the departmental dose-optimization program which includes automated exposure control, adjustment of the mA and/or kV according to patient size and/or use of iterative reconstruction technique. COMPARISON:  MRI 04/28/2021 FINDINGS: Segmentation: The lowest lumbar type non-rib-bearing vertebra is labeled as L5. Alignment: 6 mm anterolisthesis at L4-5, as was present on the prior MRI from 04/28/2021. Vertebrae: Posterolateral rod and pedicle screw fixation at L4-L5-S1 with interbody graft material and interbody spacers noted. Prior facetectomies at L4-5 and L5-S1. Small locules of gas density in the spinal canal the L3-4 level on image sixty-five series 3, I am uncertain if these are contained in synovial cysts/disc material or not. Additional small focus of left anterior epidural gas at the L4 vertebral level on image 72 series 3 and along the right facetectomy site at L4-5. Trace amount of gas to the right of the L4 spinous process tip on image 73 series 3. Speckled gas noted in the left neural foraminal region at L4-5. Loss of intervertebral disc height along with degenerative endplate findings at all levels between T12-L3. Paraspinal and other soft tissues: Bilateral nonobstructive nephrolithiasis. Severe atherosclerosis with substantial calcification apparently filling the lumen of the distal abdominal aorta above the bifurcation for example on image 67 series 3. Above the urinary bladder, there is a 5.6 by 4.8 cm fluid density lesion which could be a bladder diverticulum or a left ovarian cystic  lesion, this has simple fluid characteristics. Disc levels: Aside from the above findings, there is left greater than right foraminal impingement at L5-S1 primarily due to intervertebral spurring. IMPRESSION: 1. Scattered small locules of gas density in the  spinal canal at L3-4, at L4, in the left neural foramen at L4-5, along the right facetectomy site at L4-5, and in the soft tissues just to the right of the L4 spinous process tip. Although some of this could conceivably be explainable by nitrogen gas phenomenon within synovial cysts or ectopic disc material, infectious process must be considered a concern and MRI lumbar spine is recommended. Also correlate with any clinical signs and symptoms of infection. 2. Markedly severe abdominal aortic atherosclerosis with apparent filling of the lumen of the distal abdominal aorta causing at least markedly severe stenosis. 3. 5.6 cm fluid density lesion sitting above the urinary bladder could be from a bladder diverticulum or a left ovarian cyst, with the latter favored. This finding is not fully characterized. Consider follow up pelvic sonography for further characterization. Electronically Signed   By: Gaylyn RongWalter  Liebkemann M.D.   On: 09-10-21 15:22   DG Chest Port 1 View  Result Date: 11/09/2021 CLINICAL DATA:  Questionable sepsis EXAM: PORTABLE CHEST 1 VIEW COMPARISON:  None Available. FINDINGS: Normal cardiac silhouette. Bibasilar mild airspace disease greater on the LEFT. Upper lungs clear. No pneumothorax. No acute osseous abnormality. IMPRESSION: Bibasilar airspace disease suggests pulmonary edema versus infiltrate. Greater density on the LEFT. Electronically Signed   By: Genevive BiStewart  Edmunds M.D.   On: 09-10-21 15:20    Procedures ARTERIAL BLOOD GAS  Date/Time: 11/02/2021 4:53 PM  Performed by: Margarita Grizzleay, Kesa Birky, MD Authorized by: Margarita Grizzleay, Marek Nghiem, MD   Consent:    Consent obtained:  Verbal   Consent given by:  Patient   Risks discussed:  Bleeding    Alternatives discussed:  No treatment Universal protocol:    Immediately prior to procedure, a time out was called: yes     Patient identity confirmed:  Verbally with patient Anesthesia:    Anesthesia method:  None Procedure details:    Location of arterial blood gas: left brachial.   Number of attempts:  1 Post-procedure details:    Dressing applied: no     Bleeding control:  Hemostasis achieved   Circulation, movement, and sensation:  Normal   Procedure completion:  Tolerated BLADDER CATHETERIZATION  Date/Time: 11/13/2021 4:55 PM  Performed by: Margarita Grizzleay, Susanna Benge, MD Authorized by: Margarita Grizzleay, Clydine Parkison, MD   Consent:    Consent obtained:  Verbal   Consent given by:  Patient   Alternatives discussed:  No treatment Pre-procedure details:    Procedure purpose:  Diagnostic   Preparation: Patient was prepped and draped in usual sterile fashion   Anesthesia:    Anesthesia method:  None Procedure details:    Provider performed due to:  Nurse unable to complete   Catheter insertion:  Indwelling   Catheter type:  Foley   Catheter size:  16 Fr   Bladder irrigation: no     Number of attempts:  1   Urine characteristics:  Clear Post-procedure details:    Procedure completion:  Tolerated .Critical Care  Performed by: Margarita Grizzleay, Janeka Libman, MD Authorized by: Margarita Grizzleay, Ireoluwa Gorsline, MD   Critical care provider statement:    Critical care time (minutes):  90   Critical care was necessary to treat or prevent imminent or life-threatening deterioration of the following conditions:  Cardiac failure, circulatory failure, sepsis and CNS failure or compromise   Critical care was time spent personally by me on the following activities:  Blood draw for specimens, discussions with consultants, evaluation of patient's response to treatment, examination of patient, ordering and performing treatments and interventions, ordering and review of laboratory  studies, ordering and review of radiographic studies, re-evaluation of  patient's condition and review of old charts     Medications Ordered in ED Medications  lactated ringers infusion (has no administration in time range)  lactated ringers bolus 1,000 mL (0 mLs Intravenous Stopped 10/26/2021 1615)    And  lactated ringers bolus 1,000 mL (1,000 mLs Intravenous New Bag/Given 10/18/2021 1617)    And  lactated ringers bolus 250 mL (has no administration in time range)  vancomycin (VANCOCIN) IVPB 1000 mg/200 mL premix (has no administration in time range)  acetaminophen (TYLENOL) tablet 650 mg (has no administration in time range)  potassium chloride 10 mEq in 100 mL IVPB (has no administration in time range)  lactated ringers bolus 2,000 mL (has no administration in time range)  fentaNYL (SUBLIMAZE) injection 100 mcg (has no administration in time range)  fentaNYL (SUBLIMAZE) injection 0-50 mcg (has no administration in time range)  ceFEPIme (MAXIPIME) 2 g in sodium chloride 0.9 % 100 mL IVPB (0 g Intravenous Stopped 11/03/2021 1615)  metroNIDAZOLE (FLAGYL) IVPB 500 mg (500 mg Intravenous New Bag/Given 11/07/2021 1615)    ED Course/ Medical Decision Making/ A&P Clinical Course as of 10/26/2021 1733  Fri Oct 20, 2021  1537 CT reviewed and radiologist interpretation noted with gas noted in soft tissue. [DR]  1549 Complete metabolic panel reviewed interpreted significant for hypokalemia with potassium of 3.1 and CO2 decreased at 17 AST elevated at 166 [DR]  1553 Chest x-Jannett Schmall reviewed interpreted and concerning for pneumonia [DR]  1700 Initial lactic acid obtained at 8.1 [DR]  1711 BG reviewed interpreted pH 7.39 PCO2 22 PO2 78 this is on 4 L nasal cannula [DR]    Clinical Course User Index [DR] Margarita Grizzle, MD                           Medical Decision Making 66 year old female recent back surgery presents today with fever and back pain and appears acutely ill. She is initiated as code sepsis Broad-spectrum antibiotics are ordered Imaging of her chest and back are  ordered Consult placed to neurosurgery-discussed care with neurology PA.  She reviewed CT and advises no obvious signs of infection and MRI will be needed Reviewed CT scan and discussed with Selena Batten, NP, on-call for Dr. Yetta Barre.  She is aware of patient, gas seen on ct scan and concern for infection and will see in consultation X-Llewyn Heap reviewed interpreted concerning for pneumonia Patient with oxygen sats decreased to 81%.  Husband states she is on oxygen at home as needed. Oxygen has been started 4:23 PM Discussed with mri tech and advise that they can likely get mri in about an hour  5:31 PM Dr. Celine Mans at bedside and care assumed Discussed with Dr.Butler, EDP and aware of patient    Amount and/or Complexity of Data Reviewed Labs: ordered. Decision-making details documented in ED Course. Radiology: ordered and independent interpretation performed. Decision-making details documented in ED Course. ECG/medicine tests: ordered. Discussion of management or test interpretation with external provider(s): Care discussed with Selena Batten nurse practitioner surrogate for Dr. Yetta Barre x2 Care discussed with Dr. Celine Mans, on-call for critical care  Risk OTC drugs. Prescription drug management.           Final Clinical Impression(s) / ED Diagnoses Final diagnoses:  Sepsis, due to unspecified organism, unspecified whether acute organ dysfunction present [facility])    Rx / DC Orders ED Discharge Orders     None  Margarita Grizzle, MD 10/23/2021 [phone]

## 2021-10-20 NOTE — Sepsis Progress Note (Signed)
eLink is following this Code Sepsis. °

## 2021-10-20 NOTE — ED Notes (Signed)
Pt noted desatting to 80% room air, O2 [place] applied at 2 liters/min

## 2021-10-20 NOTE — Progress Notes (Signed)
eLink Physician-Brief Progress Note Patient Name: Andrea Houston DOB: 09-19-1955 MRN: [id number]   Date of Service  11/09/2021  HPI/Events of Note  Multiple issues: 1. Lactic Acid > 9.0. 2. Ventilator settings 100%/PRVC 22/TV 360/P 5 = 7.14/53/92/17.9. Hgb = 10.0. Nursing reports that patient is mottled.   eICU Interventions  Plan: Bolus with 0.9 NaCl 1 liter IV over 1 hour now. Repeat Lactic Acid at 12 midnight. Increase PRVC rate to 30. NaHCO3 100 meq IV now. NaHCO3 IV infusion to run at 100 mL/hour. Repeat ABG at 12 midnight.      Intervention Category Major Interventions: Respiratory failure - evaluation and management;Acid-Base disturbance - evaluation and management  [name] 11/11/2021, 9:34 PM

## 2021-10-20 NOTE — Progress Notes (Signed)
eLink Physician-Brief Progress Note Patient Name: Andrea Houston DOB: 1955-05-12 MRN: [id number]   Date of Service  11/04/2021  HPI/Events of Note  Multiple issues: 1. Request to review post intubation CXR. 2. Patient has Foley catheter and no order for Foley catheter and 3. Hyperkalemia - K+ = 3.1. Can't take ordered PO replacement.  Review of CXR reveals: 1. Endotracheal intubation, tube tip in satisfactory position about 2 cm above the carina. Esophagogastric tube with tip and side port below the diaphragm. 2. Diffuse bilateral interstitial pulmonary opacity and probable small layering left pleural effusion, most consistent with edema in the setting cardiomegaly.  eICU Interventions  Plan: Continue Foley catheter.  Change K+ replacement from PO to IV.      Intervention Category Major Interventions: Electrolyte abnormality - evaluation and management;Other:  Andrea Houston 10/31/2021, 8:06 PM

## 2021-10-20 NOTE — ED Notes (Signed)
Arrived at bedside to take pt to ICU, upon arrival pt noted disconnecting EKG and flaying arms. Spouse at bedside and stated, "She just started doing this." Writer stabalized pt by increasing O2 and instructing pt to take nice deep breaths. Once pt stablized, pt move to ICU

## 2021-10-20 NOTE — Procedures (Signed)
Intubation Procedure Note  DELMAR DONDERO  [id number]  October 20, 1955  Date:11/07/2021  Time:7:04 PM   Provider Performing:Shayleen Eppinger Chauncey Cruel Shearon Stalls    Procedure: Intubation (29574)  Indication(s) Respiratory Failure  Consent Risks of the procedure as well as the alternatives and risks of each were explained to the patient and/or caregiver.  Consent for the procedure was obtained and is signed in the bedside chart   Anesthesia Etomidate, Versed, Fentanyl, and Rocuronium   Time Out Verified patient identification, verified procedure, site/side was marked, verified correct patient position, special equipment/implants available, medications/allergies/relevant history reviewed, required imaging and test results available.   Sterile Technique Usual hand hygeine, masks, and gloves were used   Procedure Description Patient positioned in bed supine.  Sedation given as noted above.  Patient was intubated with endotracheal tube using direct laryngoscopy mac 4.  View was Grade 1 full glottis .  Number of attempts was 1.  Colorimetric CO2 detector was consistent with tracheal placement.   Complications/Tolerance None; patient tolerated the procedure well. Chest X-ray is ordered to verify placement.   EBL Minimal   Specimen(s) None

## 2021-10-20 NOTE — ED Notes (Signed)
Critical care down at bedside, MD stated they do not want pt to go to MRI at this time. Once pt receives 2 liters LR, pt may go to MRI.

## 2021-10-21 ENCOUNTER — Inpatient Hospital Stay (HOSPITAL_COMMUNITY): Payer: Medicare Other

## 2021-10-21 ENCOUNTER — Encounter (HOSPITAL_COMMUNITY): Payer: Medicare Other

## 2021-10-21 DIAGNOSIS — R6521 Severe sepsis with septic shock: Secondary | ICD-10-CM | POA: Diagnosis not present

## 2021-10-21 DIAGNOSIS — J9601 Acute respiratory failure with hypoxia: Secondary | ICD-10-CM | POA: Diagnosis not present

## 2021-10-21 DIAGNOSIS — K55059 Acute (reversible) ischemia of intestine, part and extent unspecified: Secondary | ICD-10-CM

## 2021-10-21 DIAGNOSIS — I998 Other disorder of circulatory system: Secondary | ICD-10-CM

## 2021-10-21 DIAGNOSIS — A419 Sepsis, unspecified organism: Secondary | ICD-10-CM | POA: Diagnosis not present

## 2021-10-21 LAB — BLOOD GAS, VENOUS
Acid-base deficit: 2.9 mmol/L — ABNORMAL HIGH (ref 0.0–2.0)
Bicarbonate: 22.6 mmol/L (ref 20.0–28.0)
O2 Saturation: 92.9 %
Patient temperature: 37.8
pCO2, Ven: 42 mmHg — ABNORMAL LOW (ref 44–60)
pH, Ven: 7.34 (ref 7.25–7.43)
pO2, Ven: 76 mmHg — ABNORMAL HIGH (ref 32–45)

## 2021-10-21 LAB — MAGNESIUM: Magnesium: 1.7 mg/dL (ref 1.7–2.4)

## 2021-10-21 LAB — BLOOD CULTURE ID PANEL (REFLEXED) - BCID2

## 2021-10-21 LAB — PHOSPHORUS: Phosphorus: 5.1 mg/dL — ABNORMAL HIGH (ref 2.5–4.6)

## 2021-10-21 LAB — CBC
HCT: 29.8 % — ABNORMAL LOW (ref 36.0–46.0)
Hemoglobin: 9.3 g/dL — ABNORMAL LOW (ref 12.0–15.0)
MCH: 25.5 pg — ABNORMAL LOW (ref 26.0–34.0)
MCHC: 31.2 g/dL (ref 30.0–36.0)
MCV: 81.9 fL (ref 80.0–100.0)
Platelets: 334 10*3/uL (ref 150–400)
RBC: 3.64 MIL/uL — ABNORMAL LOW (ref 3.87–5.11)
RDW: 16.2 % — ABNORMAL HIGH (ref 11.5–15.5)
WBC: 20.4 10*3/uL — ABNORMAL HIGH (ref 4.0–10.5)
nRBC: 0.2 % (ref 0.0–0.2)

## 2021-10-21 LAB — ECHOCARDIOGRAM COMPLETE BUBBLE STUDY
Area-P 1/2: 6.27 cm2
Calc EF: 63.8 %
S' Lateral: 2.7 cm
Single Plane A2C EF: 62.5 %
Single Plane A4C EF: 64.9 %

## 2021-10-21 LAB — HIV ANTIBODY (ROUTINE TESTING W REFLEX): HIV Screen 4th Generation wRfx: NONREACTIVE

## 2021-10-21 LAB — BASIC METABOLIC PANEL
Anion gap: 17 — ABNORMAL HIGH (ref 5–15)
BUN: 10 mg/dL (ref 8–23)
CO2: 23 mmol/L (ref 22–32)
Calcium: 8.2 mg/dL — ABNORMAL LOW (ref 8.9–10.3)
Chloride: 104 mmol/L (ref 98–111)
Creatinine, Ser: 0.99 mg/dL (ref 0.44–1.00)
GFR, Estimated: >60 mL/min (ref >60)
Glucose, Bld: 70 mg/dL (ref 70–99)
Potassium: 3 mmol/L — ABNORMAL LOW (ref 3.5–5.1)
Sodium: 144 mmol/L (ref 135–145)

## 2021-10-21 LAB — LACTIC ACID, PLASMA
Lactic Acid, Venous: 8.3 mmol/L (ref 0.5–1.9)
Lactic Acid, Venous: >9 mmol/L (ref 0.5–1.9)

## 2021-10-21 LAB — C-REACTIVE PROTEIN: CRP: 23.7 mg/dL — ABNORMAL HIGH (ref <1.0)

## 2021-10-21 LAB — GLUCOSE, CAPILLARY: Glucose-Capillary: 72 mg/dL (ref 70–99)

## 2021-10-21 MED ORDER — ACETAMINOPHEN 325 MG PO TABS: 650.0000 mg | ORAL_TABLET | Freq: Four times a day (QID) | ORAL | Status: DC | PRN

## 2021-10-21 MED ORDER — ACETAMINOPHEN 160 MG/5ML PO SOLN: 650.0000 mg | Freq: Four times a day (QID) | ORAL | Status: DC | PRN

## 2021-10-21 MED ORDER — GLYCOPYRROLATE 1 MG PO TABS: 1.0000 mg | ORAL_TABLET | ORAL | Status: DC | PRN

## 2021-10-21 MED ORDER — NOREPINEPHRINE 4 MG/250ML-% IV SOLN
0.0000 ug/min | INTRAVENOUS | Status: DC
  Administered 2021-10-21: 27 ug/min via INTRAVENOUS
  Administered 2021-10-21: 35 ug/min via INTRAVENOUS
  Administered 2021-10-21: 23 ug/min via INTRAVENOUS
  Administered 2021-10-21: 10 ug/min via INTRAVENOUS
  Filled 2021-10-21 (×3): qty 250

## 2021-10-21 MED ORDER — GLYCOPYRROLATE 0.2 MG/ML IJ SOLN: 0.2000 mg | INTRAMUSCULAR | Status: DC | PRN

## 2021-10-21 MED ORDER — SODIUM CHLORIDE 0.9% FLUSH: 10.0000 mL | INTRAVENOUS | Status: DC | PRN

## 2021-10-21 MED ORDER — POLYVINYL ALCOHOL 1.4 % OP SOLN: 1.0000 [drp] | Freq: Four times a day (QID) | OPHTHALMIC | Status: DC | PRN

## 2021-10-21 MED ORDER — POTASSIUM CHLORIDE 20 MEQ PO PACK
40.0000 meq | PACK | Freq: Once | ORAL | Status: AC
  Administered 2021-10-21: 40 meq
  Filled 2021-10-21: qty 2

## 2021-10-21 MED ORDER — MAGNESIUM SULFATE 4 GM/100ML IV SOLN
4.0000 g | Freq: Once | INTRAVENOUS | Status: AC
  Administered 2021-10-21: 4 g via INTRAVENOUS
  Filled 2021-10-21: qty 100

## 2021-10-21 MED ORDER — HEPARIN (PORCINE) 25000 UT/250ML-% IV SOLN
1050.0000 [IU]/h | INTRAVENOUS | Status: DC
  Administered 2021-10-21: 1050 [IU]/h via INTRAVENOUS
  Filled 2021-10-21: qty 250

## 2021-10-21 MED ORDER — SODIUM CHLORIDE 0.9% FLUSH
10.0000 mL | Freq: Two times a day (BID) | INTRAVENOUS | Status: DC
  Administered 2021-10-21: 10 mL

## 2021-10-21 MED ORDER — ORAL CARE MOUTH RINSE: 15.0000 mL | OROMUCOSAL | Status: DC | PRN

## 2021-10-21 MED ORDER — IOHEXOL 350 MG/ML SOLN
100.0000 mL | Freq: Once | INTRAVENOUS | Status: AC | PRN
  Administered 2021-10-21: 100 mL via INTRAVENOUS

## 2021-10-21 MED ORDER — MORPHINE 100MG IN NS 100ML (1MG/ML) PREMIX INFUSION
0.0000 mg/h | INTRAVENOUS | Status: DC
  Administered 2021-10-21: 5 mg/h via INTRAVENOUS
  Administered 2021-10-21: 40 mg/h via INTRAVENOUS
  Filled 2021-10-21 (×2): qty 100

## 2021-10-21 MED ORDER — SODIUM CHLORIDE 0.9 % IV SOLN: INTRAVENOUS | Status: DC

## 2021-10-21 MED ORDER — POTASSIUM CHLORIDE 20 MEQ PO PACK
80.0000 meq | PACK | Freq: Once | ORAL | Status: AC
  Administered 2021-10-21: 80 meq
  Filled 2021-10-21: qty 4

## 2021-10-21 MED ORDER — MORPHINE BOLUS VIA INFUSION: 5.0000 mg | INTRAVENOUS | Status: DC | PRN

## 2021-10-21 MED ORDER — ACETAMINOPHEN 650 MG RE SUPP: 650.0000 mg | Freq: Four times a day (QID) | RECTAL | Status: DC | PRN

## 2021-10-21 MED ORDER — PROPOFOL 1000 MG/100ML IV EMUL
5.0000 ug/kg/min | INTRAVENOUS | Status: DC
  Administered 2021-10-21: 45 ug/kg/min via INTRAVENOUS
  Administered 2021-10-21 (×2): 5 ug/kg/min via INTRAVENOUS
  Administered 2021-10-21: 55 ug/kg/min via INTRAVENOUS
  Filled 2021-10-21 (×3): qty 100

## 2021-10-21 MED ORDER — NOREPINEPHRINE 4 MG/250ML-% IV SOLN
2.0000 ug/min | INTRAVENOUS | Status: DC
  Administered 2021-10-21: 2 ug/min via INTRAVENOUS
  Filled 2021-10-21 (×2): qty 250

## 2021-10-21 MED ORDER — ORAL CARE MOUTH RINSE
15.0000 mL | OROMUCOSAL | Status: DC
  Administered 2021-10-21: 15 mL via OROMUCOSAL

## 2021-10-21 MED ORDER — SODIUM CHLORIDE 0.9 % IV SOLN
250.0000 mL | INTRAVENOUS | Status: DC
  Administered 2021-10-21 (×2): 250 mL via INTRAVENOUS

## 2021-10-22 LAB — URINE CULTURE: Culture: NO GROWTH

## 2021-10-23 LAB — CULTURE, BLOOD (ROUTINE X 2): Special Requests: ADEQUATE

## 2021-10-24 LAB — CULTURE, BLOOD (ROUTINE X 2)

## 2021-11-14 NOTE — Progress Notes (Signed)
An USGPIV (ultrasound guided PIV) has been placed for short-term vasopressor infusion. A correctly placed ivWatch must be used when administering Vasopressors. Should this treatment be needed beyond 72 hours, central line access should be obtained.  It will be the responsibility of the bedside nurse to follow best practice to prevent extravasations.   ?

## 2021-11-14 NOTE — Consult Note (Addendum)
Hospital Consult    Reason for Consult: Bilateral lower extremities mottled Requesting Physician: Dr. Celine Mans MRN #:  [id number]  History of Present Illness: This is a 66 y.o. female who presented to the hospital yesterday with bilateral lower extremity weakness, back pain.  Patient had recent history of L4-L5 lumbar fusion on the 15th.  4 days ago, she had acute onset back pain lower extremity weakness with urinary and fecal urgency.  Her husband asked her to go to the hospital, she refused.  Yesterday morning she complained of worsening back pain, was unable to walk, and was incontinent.  On exam she had a mild leukocytosis, was able to move her legs but was noted to have some altered mental status.  There is concern for postoperative wound infection/epidural abscess.  Lactate was elevated.  Over the course of the last 36 hours, lower extremities are noted to be mottled.  Leukocytosis increased, lactate continued to rise, patient was intubated.  Vascular ultrasound demonstrated low flow state, CT angio abdomen pelvis followed.  This illustrated multilevel occlusive disease, distal aortic near-occlusion from atherosclerotic disease.  Vascular surgery was called for further recommendations.  On exam, Chip Boer was intubated, sedated, requiring multiple pressors.   Past Medical History:  Diagnosis Date   Anxiety    CAD (coronary artery disease)    3 stents to mid RCA 2012 by Dr. Rockne Menghini in [place], [place]   COPD (chronic obstructive pulmonary disease) [facility])    Renal insufficiency    SBO (small bowel obstruction) (HCC) 03/08/2016   Sleep apnea    Uric acid nephrolithiasis 04/24/2016   Urinary tract infection 04/24/2016    Past Surgical History:  Procedure Laterality Date   APPENDECTOMY     CHOLECYSTECTOMY     CORONARY ANGIOPLASTY  09/2010   with stent placement   PR cysto/uretero w/lithotripsy     indwelling stent insertion, stents have since been removed    Allergies  Allergen  Reactions   Codeine Nausea And Vomiting and Nausea Only    Patient tolerates with a anti-nausea medication.    Prior to Admission medications   Medication Sig Start Date End Date Taking? Authorizing Provider  Cholecalciferol (VITAMIN D3 PO) Take 1 tablet by mouth every evening.    [provider]  Choline Fenofibrate (FENOFIBRIC ACID) 135 MG CPDR Take 135 mg by mouth at bedtime. 05/30/21   [provider]  cloNIDine (CATAPRES) 0.1 MG tablet Take 0.1 mg by mouth at bedtime. 08/14/21   [provider]  clopidogrel (PLAVIX) 75 MG tablet Take 75 mg by mouth daily. 05/18/21   [provider]  cyclobenzaprine (FLEXERIL) 5 MG tablet Take 5 mg by mouth 2 (two) times daily. 07/22/21   [provider]  ezetimibe (ZETIA) 10 MG tablet Take 10 mg by mouth in the morning.    [provider]  furosemide (LASIX) 40 MG tablet Take 40 mg by mouth See admin instructions. Take 1 tablet (40 mg) by mouth scheduled every morning & may take an additional dose in the afternoon if still swollen. 06/29/21   [provider]  gabapentin (NEURONTIN) 300 MG capsule Take 300 mg by mouth at bedtime. 09/28/21   [provider]  HYDROcodone-acetaminophen (NORCO/VICODIN) 5-325 MG tablet Take 1 tablet by mouth 2 (two) times daily as needed. 03/28/21   [provider]  ipratropium-albuterol (DUONEB) 0.5-2.5 (3) MG/3ML SOLN Take 3 mLs by nebulization every 6 (six) hours as needed (wheezing/shortness of breath).    [provider]  Boris Lown  Oil 500 MG CAPS Take 500 mg by mouth in the morning.    [provider]  LORazepam (ATIVAN) 0.5 MG tablet Take 0.5 mg by mouth 3 (three) times daily as needed for anxiety. 08/14/21   [provider]  methocarbamol (ROBAXIN) 500 MG tablet Take 1 tablet (500 mg total) by mouth 2 (two) times daily. 04/10/21   Marita Kansas, PA-C  methocarbamol (ROBAXIN-750) 750 MG tablet Take 1 tablet (750 mg total) by mouth 4  (four) times daily. 08/30/21   Meyran, Tiana Loft, NP  metoprolol succinate (TOPROL-XL) 50 MG 24 hr tablet Take 50 mg by mouth in the morning. 06/28/21   [provider]  nitrofurantoin, macrocrystal-monohydrate, (MACROBID) 100 MG capsule Take 100 mg by mouth in the morning. 08/15/21   [provider]  nystatin (MYCOSTATIN) 100000 UNIT/ML suspension Take 10-20 mLs by mouth 2 (two) times daily as needed (oral thrush). 05/16/21   [provider]  ondansetron (ZOFRAN) 4 MG tablet Take 4 mg by mouth every 8 (eight) hours as needed for nausea. 07/06/21   [provider]  oxyCODONE-acetaminophen (PERCOCET) 5-325 MG tablet Take 1 tablet by mouth every 4 (four) hours as needed for severe pain. 08/30/21 08/30/22  Meyran, Tiana Loft, NP  oxyCODONE-acetaminophen (PERCOCET/ROXICET) 5-325 MG tablet Take 0.5-1 tablets by mouth every 6 (six) hours as needed for severe pain.    [provider]  pantoprazole (PROTONIX) 40 MG tablet Take 40 mg by mouth in the morning. 06/12/21   [provider]  PARoxetine (PAXIL) 20 MG tablet Take 20 mg by mouth every evening. 08/21/21   [provider]  PARoxetine (PAXIL) 40 MG tablet Take 40 mg by mouth in the morning. 08/14/21   [provider]  potassium chloride SA (KLOR-CON M) 20 MEQ tablet Take 20 mEq by mouth daily. 09/27/21   [provider]  POTASSIUM PO Take 1 tablet by mouth in the morning.    [provider]  promethazine-dextromethorphan (PROMETHAZINE-DM) 6.25-15 MG/5ML syrup Take 5 mLs by mouth every 4 (four) hours as needed. 05/26/21   [provider]  rosuvastatin (CRESTOR) 40 MG tablet Take 40 mg by mouth at bedtime. 05/30/21   [provider]  temazepam (RESTORIL) 30 MG capsule Take 30 mg by mouth at bedtime. 05/29/21   [provider]  TRELEGY ELLIPTA 100-62.5-25 MCG/ACT AEPB Inhale 1 puff into the lungs every other day. 02/28/21   [provider]   VENTOLIN HFA 108 (90 Base) MCG/ACT inhaler Inhale 1-2 puffs into the lungs every 6 (six) hours as needed for wheezing or shortness of breath. 02/28/21   [provider]    Social History   Socioeconomic History   Marital status: Married    Spouse name: Lollie Sails   Number of children: 1   Years of education: Not on file   Highest education level: Not on file  Occupational History   Not on file  Tobacco Use   Smoking status: Former    Types: Cigarettes    Quit date: 07/2021    Years since quitting: 0.2   Smokeless tobacco: Never  Vaping Use   Vaping Use: Never used  Substance and Sexual Activity   Alcohol use: Not Currently   Drug use: Never   Sexual activity: Not Currently  Other Topics Concern   Not on file  Social History Narrative   Not on file   Social Determinants of Health   Financial Resource Strain: Not on file  Food Insecurity: Not  on file  Transportation Needs: Not on file  Physical Activity: Not on file  Stress: Not on file  Social Connections: Not on file  Intimate Partner Violence: Not on file   History reviewed. No pertinent family history.  ROS: Otherwise negative unless mentioned in HPI  Physical Examination  Vitals:   10/20/2021 1645 10/16/2021 1700  BP: (!) 91/47 (!) 83/47  Pulse: (!) 133 (!) 131  Resp: (!) 30 (!) 30  Temp:    SpO2: 94% 94%   Body mass index is 31.82 kg/m.  General: Intubated sedated Gait: Not observed HENT: Intubated Pulmonary: Intubated PEEP 8 Cardiac: Tachycardia Abdomen:  soft, NT/ND, no masses Skin: without rashes Vascular Exam/Pulses: none in the feet Extremities: with ischemic changes, without Gangrene , without cellulitis; without open wounds;  Bilateral ankles with rigor.  Mottling to the knees. Musculoskeletal: no muscle wasting or atrophy  Neurologic: A&O X 3;  No focal weakness or paresthesias are detected; speech is fluent/normal Psychiatric:  Unable to assess.  Lymph:  Unremarkable  CBC     Component Value Date/Time   WBC 20.4 (H) 10/20/2021 0031   RBC 3.64 (L) 11/06/2021 0031   HGB 9.3 (L) 10/18/2021 0031   HCT 29.8 (L) 11/08/2021 0031   PLT 334 11/03/2021 0031   MCV 81.9 11/04/2021 0031   MCH 25.5 (L) 10/14/2021 0031   MCHC 31.2 11/03/2021 0031   RDW 16.2 (H) 10/19/2021 0031   LYMPHSABS 0.7 11/02/2021 1431   MONOABS 0.4 11/03/2021 1431   EOSABS 0.0 10/29/2021 1431   BASOSABS 0.0 10/27/2021 1431    BMET    Component Value Date/Time   NA 144 10/23/2021 0031   K 3.0 (L) 10/31/2021 0031   CL 104 11/02/2021 0031   CO2 23 11/11/2021 0031   GLUCOSE 70 11/03/2021 0031   BUN 10 10/26/2021 0031   CREATININE 0.99 11/01/2021 0031   CALCIUM 8.2 (L) 10/23/2021 0031   GFRNONAA >60 10/17/2021 0031    COAGS: Lab Results  Component Value Date   INR 1.4 (H) 10/16/2021   INR 1.1 08/25/2021     ASSESSMENT/PLAN: This is a 66 y.o. female who presents with acute on chronic limb ischemia that has been present for several days. This has been further exacerbated by pressor requirement.  On exam bilateral lower extremities are nonviable below the knee.  I am concerned with her low flow state that even bilateral above-knee amputations would have difficulty healing.  I offered bilateral above-knee amputations to Vicki's husband as a lifesaving measure.  He stated she would not want to live with bilateral amputations.  Unfortunately, there are no revascularization options at this time.  After discussing the above with her husband Lynann Bologna, he elected to pursue comfort care measures.  I will discuss this with Dr. Shearon Stalls.   Cassandria Santee MD [facility] [phone] 11/05/2021  5:30 PM

## 2021-11-14 NOTE — Progress Notes (Signed)
2 RT attempted ABG without any success. Elink notified. Possible VBG can be done. RN aware.

## 2021-11-14 NOTE — Progress Notes (Signed)
eLink Physician-Brief Progress Note Patient Name: Andrea Houston DOB: 04-04-1956 MRN: [id number]   Date of Service  10/22/2021  HPI/Events of Note  Agitation - Nursing request for additional sedation.   eICU Interventions  Will increase ceiling on Fentanyl IV infusion to 400 mcg/hour.     Intervention Category Major Interventions: Delirium, psychosis, severe agitation - evaluation and management  Alois Colgan Eugene 11/07/2021, 4:22 AM

## 2021-11-14 NOTE — Progress Notes (Signed)
ANTICOAGULATION CONSULT NOTE - Initial Consult  Pharmacy Consult for Heparin Indication: rule out acute arterial occlusion lower extremities  Allergies  Allergen Reactions   Codeine Nausea And Vomiting and Nausea Only    Patient tolerates with a anti-nausea medication.    Patient Measurements: Height: 5' (152.4 cm) Weight: 73.9 kg (162 lb 14.7 oz) IBW/kg (Calculated) : 45.5 Heparin Dosing Weight: 58.2 kg  Vital Signs: Temp: 101.2 F (38.4 C) (07/08 0400) Temp Source: Axillary (07/08 0400) BP: 98/33 (07/08 0830) Pulse Rate: 138 (07/08 0830)  Labs: Recent Labs    11-13-2021 1431 11/07/2021 0031  HGB 10.0* 9.3*  HCT 31.3* 29.8*  PLT 413* 334  APTT 32  --   LABPROT 16.6*  --   INR 1.4*  --   CREATININE 0.92 0.99    Estimated Creatinine Clearance: 51.6 mL/min (by C-G formula based on SCr of 0.99 mg/dL).   Medical History: Past Medical History:  Diagnosis Date   Anxiety    CAD (coronary artery disease)    3 stents to mid RCA 2012 by Dr. Rockne Menghini in [place], [place]   COPD (chronic obstructive pulmonary disease) [facility])    Renal insufficiency    SBO (small bowel obstruction) (HCC) 03/08/2016   Sleep apnea    Uric acid nephrolithiasis 04/24/2016   Urinary tract infection 04/24/2016    Medications:  Scheduled:   arformoterol  15 mcg Nebulization BID   budesonide (PULMICORT) nebulizer solution  0.5 mg Nebulization BID   Chlorhexidine Gluconate Cloth  6 each Topical Daily   docusate  100 mg Per Tube BID   fentaNYL (SUBLIMAZE) injection  50 mcg Intravenous Once   heparin  5,000 Units Subcutaneous Q8H   ipratropium  0.5 mg Nebulization q12n4p   mouth rinse  15 mL Mouth Rinse Q2H   pantoprazole sodium  40 mg Per Tube Daily   polyethylene glycol  17 g Per Tube Daily   potassium chloride  80 mEq Per Tube Once   Infusions:   sodium chloride 10 mL/hr at 10/16/2021 0800   ceFEPime (MAXIPIME) IV Stopped (10/27/2021 0416)   fentaNYL infusion INTRAVENOUS 400 mcg/hr (11/06/2021 0800)    lactated ringers 100 mL/hr at 11/05/2021 0800   magnesium sulfate bolus IVPB     metronidazole Stopped (11/06/2021 0530)   norepinephrine (LEVOPHED) Adult infusion 6 mcg/min (10/16/2021 0800)   propofol (DIPRIVAN) infusion 15 mcg/kg/min (10/28/2021 0800)   sodium bicarbonate 150 mEq in sterile water 1,150 mL infusion 150 mL/hr at 11/09/2021 0800   vancomycin Stopped (10/26/2021 0635)   PRN: docusate sodium, fentaNYL, midazolam, midazolam, mouth rinse, pantoprazole (PROTONIX) IV, polyethylene glycol  Assessment: 66 yo female s/p PLIF L4-L5 on 08/28/21 who presents with epidural abscess.  Now concern for acute arterial occlusion, lower extremity arterial dopplers pending.  Pharmacy consulted for heparin dosing.  Baseline PTT 32, PT/INR 16.6/1.4 CBC: Hgb low 9.3, Plts WNL Received 5000 units SQ heparin at ~6AM No bleeding issues currently reported  Goal of Therapy:  Heparin level 0.3-0.7 units/ml Monitor platelets by anticoagulation protocol: Yes   Plan:  No heparin bolus since received SQ dose this AM Start heparin infusion at 1050 units/hr Check anti-Xa level in 8 hours and daily while on heparin Continue to monitor H&H and platelets  Loralee Pacas, PharmD, BCPS Pharmacy: [phone] 11/06/2021,8:51 AM

## 2021-11-14 NOTE — Progress Notes (Signed)
eLink Physician-Brief Progress Note Patient Name: Andrea Houston DOB: 1956/01/05 MRN: [id number]   Date of Service  10/26/2021  HPI/Events of Note  VBG on PRVC 30 + NaHCO3 IV infusion = 7.34/42  eICU Interventions  Continue present management.     Intervention Category Major Interventions: Respiratory failure - evaluation and management;Acid-Base disturbance - evaluation and management  [name] 11/03/2021, 2:41 AM

## 2021-11-14 NOTE — Progress Notes (Signed)
Aorta/iliac artery duplex and bilateral lower extremity arterial duplex completed. Refer to "CV Proc" under chart review to view preliminary results.  11/04/2021 10:26 AM Eula Fried., MHA, RVT, RDCS, RDMS

## 2021-11-14 NOTE — Progress Notes (Signed)
Pressors not needed at this time. Rn will consult if I.V. needed at a later time.

## 2021-11-14 NOTE — Consult Note (Signed)
Reason for Consult: Lumbar wound infection Referring Physician: Critical care  Andrea Houston is an 66 y.o. female.  HPI: 66 year old female approximately 8 weeks status post two-level lumbar decompression and fusion by Dr. Ronnald Ramp.  Surgery uncomplicated.  Patient with increasing pain over the past week.  Patient with increasing radiation to her lower extremities with some subjective feelings of weakness.  Was evaluated in the emergency department at [facility] today.  In the process of getting an MRI scan of her lumbar spine she developed severe respiratory failure requiring intubation and transfer to the ICU.  The patient had signs and symptoms of sepsis and shock.  She was resuscitated.  She has been placed on low-dose pressors.  She has been intermittently febrile.  Urine output has been marginal over the last shift.  MRI scan was done last night which demonstrates postoperative change at L4-5 and S1 without evidence of obvious complicating features.  The patient does have a dorsal fluid collection predominantly at the L4 level with evidence of probable deep wound space infection.  There is a small amount which tracks behind the lamina of L3 which does cause some thecal sac compression but no high-grade nerve root compression.  Past Medical History:  Diagnosis Date   Anxiety    CAD (coronary artery disease)    3 stents to mid RCA 2012 by Dr. Rosalita Chessman in [place], [place]   COPD (chronic obstructive pulmonary disease) [facility])    Renal insufficiency    SBO (small bowel obstruction) ([place]) 03/08/2016   Sleep apnea    Uric acid nephrolithiasis 04/24/2016   Urinary tract infection 04/24/2016    Past Surgical History:  Procedure Laterality Date   APPENDECTOMY     CHOLECYSTECTOMY     CORONARY ANGIOPLASTY  09/2010   with stent placement   PR cysto/uretero w/lithotripsy     indwelling stent insertion, stents have since been removed    History reviewed. No pertinent family history.  Social  History:  reports that she quit smoking about 3 months ago. Her smoking use included cigarettes. She has never used smokeless tobacco. She reports that she does not currently use alcohol. She reports that she does not use drugs.  Allergies:  Allergies  Allergen Reactions   Codeine Nausea And Vomiting and Nausea Only    Patient tolerates with a anti-nausea medication.    Medications: I have reviewed the patient's current medications.  Results for orders placed or performed during the hospital encounter of 11/12/2021 (from the past 48 hour(s))  Lactic acid, plasma     Status: Abnormal   Collection Time: 10/29/2021  2:31 PM  Result Value Ref Range   Lactic Acid, Venous 8.3 (HH) 0.5 - 1.9 mmol/L    Comment: CRITICAL RESULT CALLED TO, READ BACK BY AND VERIFIED WITH: RN D [place] AT [id number] 11/01/2021 CRUICKSHANK A Performed at [facility], [place] [address] [place], [place] [address]   Comprehensive metabolic panel     Status: Abnormal   Collection Time: 11/06/2021  2:31 PM  Result Value Ref Range   Sodium 138 135 - 145 mmol/L   Potassium 3.1 (L) 3.5 - 5.1 mmol/L   Chloride 107 98 - 111 mmol/L   CO2 17 (L) 22 - 32 mmol/L   Glucose, Bld 86 70 - 99 mg/dL    Comment: Glucose reference range applies only to samples taken after fasting for at least 8 hours.   BUN 9 8 - 23 mg/dL   Creatinine, Ser 0.92 0.44 -  1.00 mg/dL   Calcium 9.7 8.9 - 10.3 mg/dL   Total Protein 6.7 6.5 - 8.1 g/dL   Albumin 2.6 (L) 3.5 - 5.0 g/dL   AST 166 (H) 15 - 41 U/L   ALT 37 0 - 44 U/L   Alkaline Phosphatase 91 38 - 126 U/L   Total Bilirubin 0.9 0.3 - 1.2 mg/dL   GFR, Estimated >60 >60 mL/min    Comment: (NOTE) Calculated using the CKD-EPI Creatinine Equation (2021)    Anion gap 14 5 - 15    Comment: Performed at [facility], [place] [address], [place], [place] [address]  CBC with Differential     Status: Abnormal   Collection Time: 10/27/2021  2:31 PM  Result Value Ref Range   WBC  10.6 (H) 4.0 - 10.5 K/uL   RBC 3.92 3.87 - 5.11 MIL/uL   Hemoglobin 10.0 (L) 12.0 - 15.0 g/dL   HCT 31.3 (L) 36.0 - 46.0 %   MCV 79.8 (L) 80.0 - 100.0 fL   MCH 25.5 (L) 26.0 - 34.0 pg   MCHC 31.9 30.0 - 36.0 g/dL   RDW 16.0 (H) 11.5 - 15.5 %   Platelets 413 (H) 150 - 400 K/uL   nRBC 0.3 (H) 0.0 - 0.2 %   Neutrophils Relative % 90 %   Neutro Abs 9.5 (H) 1.7 - 7.7 K/uL   Lymphocytes Relative 6 %   Lymphs Abs 0.7 0.7 - 4.0 K/uL   Monocytes Relative 4 %   Monocytes Absolute 0.4 0.1 - 1.0 K/uL   Eosinophils Relative 0 %   Eosinophils Absolute 0.0 0.0 - 0.5 K/uL   Basophils Relative 0 %   Basophils Absolute 0.0 0.0 - 0.1 K/uL   WBC Morphology MILD LEFT SHIFT (1-5% METAS, OCC MYELO, OCC BANDS)    Immature Granulocytes 0 %   Abs Immature Granulocytes 0.04 0.00 - 0.07 K/uL   Polychromasia PRESENT     Comment: Performed at [facility], [place] [address], [place], [place] [address]  Protime-INR     Status: Abnormal   Collection Time: 11/01/2021  2:31 PM  Result Value Ref Range   Prothrombin Time 16.6 (H) 11.4 - 15.2 seconds   INR 1.4 (H) 0.8 - 1.2    Comment: (NOTE) INR goal varies based on device and disease states. Performed at [facility], [place] [address] [place], [place] [address]   APTT     Status: None   Collection Time: 10/30/2021  2:31 PM  Result Value Ref Range   aPTT 32 24 - 36 seconds    Comment: Performed at [facility], [place] [address] [place], [place] [address]  Blood gas, arterial (at [facility])     Status: Abnormal   Collection Time: 10/24/2021  5:00 PM  Result Value Ref Range   pH, Arterial 7.39 7.35 - 7.45   pCO2 arterial 22 (L) 32 - 48 mmHg   pO2, Arterial 78 (L) 83 - 108 mmHg   Bicarbonate 13.3 (L) 20.0 - 28.0 mmol/L   Acid-base deficit 9.4 (H) 0.0 - 2.0 mmol/L   O2 Saturation 93.3 %   Patient temperature 39.4     Comment: Performed at [facility], [place] [address], [place],  [address]   HIV Antibody (routine testing w rflx)     Status: None   Collection Time: 11/02/2021  6:38 PM  Result Value Ref Range   HIV Screen 4th Generation wRfx Non  Reactive Non Reactive    Comment: Performed at [facility], [address], [place], [place] [address]  Sedimentation rate     Status: None   Collection Time: Nov 09, 2021  6:42 PM  Result Value Ref Range   Sed Rate 3 0 - 22 mm/hr    Comment: Performed at [facility], [address], [place], [place] [address]  Glucose, capillary     Status: Abnormal   Collection Time: 2021/11/09  6:49 PM  Result Value Ref Range   Glucose-Capillary 118 (H) 70 - 99 mg/dL    Comment: Glucose reference range applies only to samples taken after fasting for at least 8 hours.  Procalcitonin - Baseline     Status: None   Collection Time: 11-09-21  7:25 PM  Result Value Ref Range   Procalcitonin 23.63 ng/mL    Comment:        Interpretation: PCT >= 10 ng/mL: Important systemic inflammatory response, almost exclusively due to severe bacterial sepsis or septic shock. (NOTE)       Sepsis PCT Algorithm           Lower Respiratory Tract                                      Infection PCT Algorithm    ----------------------------     ----------------------------         PCT < 0.25 ng/mL                PCT < 0.10 ng/mL          Strongly encourage             Strongly discourage   discontinuation of antibiotics    initiation of antibiotics    ----------------------------     -----------------------------       PCT 0.25 - 0.50 ng/mL            PCT 0.10 - 0.25 ng/mL               OR       >80% decrease in PCT            Discourage initiation of                                            antibiotics      Encourage discontinuation           of antibiotics    ----------------------------     -----------------------------         PCT >= 0.50 ng/mL              PCT 0.26 - 0.50 ng/mL                AND       <80% decrease in PCT              Encourage initiation of                                             antibiotics       Encourage continuation           of antibiotics    ----------------------------     -----------------------------  PCT >= 0.50 ng/mL                  PCT > 0.50 ng/mL               AND         increase in PCT                  Strongly encourage                                      initiation of antibiotics    Strongly encourage escalation           of antibiotics                                     -----------------------------                                           PCT <= 0.25 ng/mL                                                 OR                                        > 80% decrease in PCT                                      Discontinue / Do not initiate                                             antibiotics  Performed at [place] [address], [place], [place] [address]   C-reactive protein     Status: Abnormal   Collection Time: 10/22/2021  7:33 PM  Result Value Ref Range   CRP 23.7 (H) <1.0 mg/dL    Comment: Performed at [place] [address] [place], [place] [address]  Lactic acid, plasma     Status: Abnormal   Collection Time: 11/12/2021  7:33 PM  Result Value Ref Range   Lactic Acid, Venous >9.0 (HH) 0.5 - 1.9 mmol/L    Comment: CRITICAL VALUE NOTED.  VALUE IS CONSISTENT WITH PREVIOUSLY REPORTED AND CALLED VALUE. Performed at [facility], [place] [address], [place], [place] [address]   MRSA Next Gen by PCR, Nasal     Status: None   Collection Time: 11/03/2021  7:36 PM   Specimen: Nasal Mucosa; Nasal Swab  Result Value Ref Range   MRSA by PCR Next Gen NOT DETECTED NOT DETECTED    Comment: (NOTE) The GeneXpert MRSA Assay (FDA approved for NASAL specimens only), is one component of a comprehensive MRSA colonization surveillance program. It is not intended to diagnose MRSA infection nor to guide or monitor treatment  for  MRSA infections. Test performance is not FDA approved in patients less than 75 years old. Performed at [facility], [place] [address], [place], [place] [address]   Blood gas, arterial     Status: Abnormal   Collection Time: 11/11/2021  8:00 PM  Result Value Ref Range   pH, Arterial 7.14 (LL) 7.35 - 7.45    Comment: CRITICAL RESULT CALLED TO, READ BACK BY AND VERIFIED WITH: RN Tyson Babinski AT 2043 11/09/2021 CRUICKSHANK A    pCO2 arterial 53 (H) 32 - 48 mmHg   pO2, Arterial 92 83 - 108 mmHg   Bicarbonate 17.9 (L) 20.0 - 28.0 mmol/L   Acid-base deficit 10.3 (H) 0.0 - 2.0 mmol/L   O2 Saturation 92 %   Patient temperature 39.0    Allens test (pass/fail) PASS PASS    Comment: Performed at [facility], [place] [address], [place], [place] [address]  Resp Panel by RT-PCR (Flu A&B, Covid)     Status: None   Collection Time: 11/13/2021  8:47 PM  Result Value Ref Range   SARS Coronavirus 2 by RT PCR NEGATIVE NEGATIVE    Comment: (NOTE) SARS-CoV-2 target nucleic acids are NOT DETECTED.  The SARS-CoV-2 RNA is generally detectable in upper respiratory specimens during the acute phase of infection. The lowest concentration of SARS-CoV-2 viral copies this assay can detect is 138 copies/mL. A negative result does not preclude SARS-Cov-2 infection and should not be used as the sole basis for treatment or other patient management decisions. A negative result may occur with  improper specimen collection/handling, submission of specimen other than nasopharyngeal swab, presence of viral mutation(s) within the areas targeted by this assay, and inadequate number of viral copies(<138 copies/mL). A negative result must be combined with clinical observations, patient history, and epidemiological information. The expected result is Negative.  Fact Sheet for Patients:  EntrepreneurPulse.com.au  Fact Sheet for Healthcare Providers:   IncredibleEmployment.be  This test is no t yet approved or cleared by the [place] FDA and  has been authorized for detection and/or diagnosis of SARS-CoV-2 by FDA under an Emergency Use Authorization (EUA). This EUA will remain  in effect (meaning this test can be used) for the duration of the COVID-19 declaration under Section 564(b)(1) of the Act, 21 U.S.C.section 360bbb-3(b)(1), unless the authorization is terminated  or revoked sooner.       Influenza A by PCR NEGATIVE NEGATIVE   Influenza B by PCR NEGATIVE NEGATIVE    Comment: (NOTE) The Xpert Xpress SARS-CoV-2/FLU/RSV plus assay is intended as an aid in the diagnosis of influenza from Nasopharyngeal swab specimens and should not be used as a sole basis for treatment. Nasal washings and aspirates are unacceptable for Xpert Xpress SARS-CoV-2/FLU/RSV testing.  Fact Sheet for Patients: EntrepreneurPulse.com.au  Fact Sheet for Healthcare Providers: IncredibleEmployment.be  This test is not yet approved or cleared by the [place] FDA and has been authorized for detection and/or diagnosis of SARS-CoV-2 by FDA under an Emergency Use Authorization (EUA). This EUA will remain in effect (meaning this test can be used) for the duration of the COVID-19 declaration under Section 564(b)(1) of the Act, 21 U.S.C. section 360bbb-3(b)(1), unless the authorization is terminated or revoked.  Performed at [facility], [place] [address], [place], [place] [address]   Urinalysis, Routine w reflex microscopic Urine, Catheterized     Status: Abnormal   Collection Time: 11/11/2021  8:51 PM  Result Value Ref Range   Color, Urine  AMBER (A) YELLOW    Comment: BIOCHEMICALS MAY BE AFFECTED BY COLOR   APPearance CLOUDY (A) CLEAR   Specific Gravity, Urine 1.015 1.005 - 1.030   pH 5.0 5.0 - 8.0   Glucose, UA NEGATIVE NEGATIVE mg/dL   Hgb urine dipstick LARGE (A)  NEGATIVE   Bilirubin Urine NEGATIVE NEGATIVE   Ketones, ur NEGATIVE NEGATIVE mg/dL   Protein, ur 427 (A) NEGATIVE mg/dL   Nitrite NEGATIVE NEGATIVE   Leukocytes,Ua NEGATIVE NEGATIVE   RBC / HPF 21-50 0 - 5 RBC/hpf   WBC, UA 0-5 0 - 5 WBC/hpf   Bacteria, UA RARE (A) NONE SEEN   Squamous Epithelial / LPF 0-5 0 - 5    Comment: Performed at [facility], [address] [place], [place] [address]  CBC     Status: Abnormal   Collection Time: 10/16/2021 12:31 AM  Result Value Ref Range   WBC 20.4 (H) 4.0 - 10.5 K/uL   RBC 3.64 (L) 3.87 - 5.11 MIL/uL   Hemoglobin 9.3 (L) 12.0 - 15.0 g/dL   HCT 62.8 (L) 31.5 - 17.6 %   MCV 81.9 80.0 - 100.0 fL   MCH 25.5 (L) 26.0 - 34.0 pg   MCHC 31.2 30.0 - 36.0 g/dL   RDW 16.0 (H) 73.7 - 10.6 %   Platelets 334 150 - 400 K/uL   nRBC 0.2 0.0 - 0.2 %    Comment: Performed at [facility], [address] [place], [place] [address]  Basic metabolic panel     Status: Abnormal   Collection Time: 10/22/2021 12:31 AM  Result Value Ref Range   Sodium 144 135 - 145 mmol/L   Potassium 3.0 (L) 3.5 - 5.1 mmol/L   Chloride 104 98 - 111 mmol/L   CO2 23 22 - 32 mmol/L   Glucose, Bld 70 70 - 99 mg/dL    Comment: Glucose reference range applies only to samples taken after fasting for at least 8 hours.   BUN 10 8 - 23 mg/dL   Creatinine, Ser 5.46 0.44 - 1.00 mg/dL   Calcium 8.2 (L) 8.9 - 10.3 mg/dL   GFR, Estimated >27 >03 mL/min    Comment: (NOTE) Calculated using the CKD-EPI Creatinine Equation (2021)    Anion gap 17 (H) 5 - 15    Comment: Performed at [facility], [address] [place], [place] [address]  Magnesium     Status: None   Collection Time: 11/13/2021 12:31 AM  Result Value Ref Range   Magnesium 1.7 1.7 - 2.4 mg/dL    Comment: Performed at [facility], [address], [place], [place] [address]  Phosphorus     Status: Abnormal   Collection Time: 10/18/2021 12:31 AM  Result  Value Ref Range   Phosphorus 5.1 (H) 2.5 - 4.6 mg/dL    Comment: Performed at [facility], [address] [place], [place] [address]  Lactic acid, plasma     Status: Abnormal   Collection Time: 10/16/2021 12:31 AM  Result Value Ref Range   Lactic Acid, Venous 8.3 (HH) 0.5 - 1.9 mmol/L    Comment: CRITICAL RESULT CALLED TO, READ BACK BY AND VERIFIED WITH: SHAW,C. 10/31/2021 @0113  BY SEEL,M. Performed at [facility], [address] [place], [place]   Blood gas, venous     Status: Abnormal   Collection Time: 10/22/2021  1:50 AM  Result Value Ref Range   pH, Ven 7.34  7.25 - 7.43   pCO2, Ven 42 (L) 44 - 60 mmHg   pO2, Ven 76 (H) 32 - 45 mmHg   Bicarbonate 22.6 20.0 - 28.0 mmol/L   Acid-base deficit 2.9 (H) 0.0 - 2.0 mmol/L   O2 Saturation 92.9 %   Patient temperature 37.8     Comment: Performed at [facility], [address], [place], [place] [address]    DG Chest Port 1 View  Result Date: 10/26/2021 CLINICAL DATA:  Respiratory failure. EXAM: PORTABLE CHEST 1 VIEW COMPARISON:  10/16/2021 FINDINGS: Stable position of ET tube and enteric tube. Aortic atherosclerotic calcifications. Cardiomediastinal contours are stable. Lung volumes are low. Unchanged left pleural effusion. Stable pulmonary edema. Decreased aeration to both lung bases identified which may represent areas of atelectasis or airspace disease. This appears unchanged from the previous exam. IMPRESSION: 1. Stable support apparatus. 2. No change in aeration a lungs compared with the previous exam. Electronically Signed   By: Signa Kell M.D.   On: 10-26-2021 06:38   MR Lumbar Spine W Wo Contrast  Result Date: 10/22/2021 CLINICAL DATA:  Low back pain prior surgery and new symptoms EXAM: MRI LUMBAR SPINE WITHOUT AND WITH CONTRAST TECHNIQUE: Multiplanar and multiecho pulse sequences of the lumbar spine were obtained without and with intravenous contrast. CONTRAST:  104mL  GADAVIST GADOBUTROL 1 MMOL/ML IV SOLN COMPARISON:  04/28/2021 FINDINGS: Segmentation:  Standard. Alignment: Grade 1 retrolisthesis at L1-2, L2-3 and L3-4 with grade 1 anterolisthesis at L4-5. Vertebrae: L4-S1 PLIF with posterior decompression. There is a dorsal epidural collection at the L3-4 levels measuring approximately 5 mm in thickness and contributing to moderate thecal sac attenuation. There is also fluid surrounding the surgical hardware, particularly at the L4 level. There is multifocal magnetic susceptibility effect within the collection in the posterior paraspinous soft tissues, likely gas. Conus medullaris and cauda equina: Conus extends to the L2 level. Conus and cauda equina appear normal. Paraspinal and other soft tissues: Posterior paraspinal fluid collection at the postoperative levels. Disc levels: L1-L2: Small disc bulge. Right lateral recess narrowing without central spinal canal stenosis. No neural foraminal stenosis. L2-L3: Mild facet hypertrophy. No disc herniation. No spinal canal stenosis. No neural foraminal stenosis. L3-L4: Small disc bulge and mild facet hypertrophy. Moderate spinal canal stenosis. No neural foraminal stenosis. L4-L5: PLIF. Attenuation of the thecal sac without bony spinal canal stenosis. No neural foraminal stenosis. L5-S1: PLIF large central disc extrusion with superior migration, unchanged. Left lateral recess narrowing without central spinal canal stenosis. No neural foraminal stenosis. Visualized sacrum: Normal. IMPRESSION: 1. Dorsal epidural collection at the L3-4 levels measuring approximately 5 mm in thickness and contributing to moderate thecal sac attenuation. This is likely a small epidural abscess. 2. Moderate thecal sac attenuation at the L4 level due to dorsal epidural and paraspinous fluid. 3. Posterior paraspinous fluid collection may also be infected. 4. Unchanged L5-S1 large central disc extrusion with superior migration. 5. Moderate L3-4 spinal canal  stenosis secondary to combination of disc bulge and facet arthrosis. Electronically Signed   By: Deatra Robinson M.D.   On: 11/05/2021 23:12   DG Abd 1 View  Result Date: 11/05/2021 CLINICAL DATA:  Check gastric catheter placement EXAM: ABDOMEN - 1 VIEW COMPARISON:  None FINDINGS: Gastric catheter is noted within the stomach. Postsurgical changes are noted in the lower lumbar spine. No obstructive changes are seen. IMPRESSION: Gastric catheter within the stomach. Electronically Signed   By: Alcide Clever M.D.   On: 11/11/2021 20:27  Portable Chest x-ray  Result Date: 11/05/2021 CLINICAL DATA:  Sepsis EXAM: PORTABLE CHEST 1 VIEW COMPARISON:  11/11/2021, 3:09 p.m. FINDINGS: Cardiomegaly. Diffuse bilateral interstitial pulmonary opacity and probable small layering left pleural effusion. Endotracheal intubation, tube tip above the trachea. Esophagogastric tube with tip and side port below the diaphragm. IMPRESSION: 1. Endotracheal intubation, tube tip above the trachea. Esophagogastric tube with tip and side port below the diaphragm. 2. Diffuse bilateral interstitial pulmonary opacity and probable small layering left pleural effusion, most consistent with edema in the setting cardiomegaly. Electronically Signed   By: Delanna Ahmadi M.D.   On: 10/27/2021 19:40   CT Lumbar Spine Wo Contrast  Result Date: 11/08/2021 CLINICAL DATA:  Low back pain.  L5-S1 fusion on 08/28/2021 EXAM: CT LUMBAR SPINE WITHOUT CONTRAST TECHNIQUE: Multidetector CT imaging of the lumbar spine was performed without intravenous contrast administration. Multiplanar CT image reconstructions were also generated. RADIATION DOSE REDUCTION: This exam was performed according to the departmental dose-optimization program which includes automated exposure control, adjustment of the mA and/or kV according to patient size and/or use of iterative reconstruction technique. COMPARISON:  MRI 04/28/2021 FINDINGS: Segmentation: The lowest lumbar type  non-rib-bearing vertebra is labeled as L5. Alignment: 6 mm anterolisthesis at L4-5, as was present on the prior MRI from 04/28/2021. Vertebrae: Posterolateral rod and pedicle screw fixation at L4-L5-S1 with interbody graft material and interbody spacers noted. Prior facetectomies at L4-5 and L5-S1. Small locules of gas density in the spinal canal the L3-4 level on image sixty-five series 3, I am uncertain if these are contained in synovial cysts/disc material or not. Additional small focus of left anterior epidural gas at the L4 vertebral level on image 72 series 3 and along the right facetectomy site at L4-5. Trace amount of gas to the right of the L4 spinous process tip on image 73 series 3. Speckled gas noted in the left neural foraminal region at L4-5. Loss of intervertebral disc height along with degenerative endplate findings at all levels between T12-L3. Paraspinal and other soft tissues: Bilateral nonobstructive nephrolithiasis. Severe atherosclerosis with substantial calcification apparently filling the lumen of the distal abdominal aorta above the bifurcation for example on image 67 series 3. Above the urinary bladder, there is a 5.6 by 4.8 cm fluid density lesion which could be a bladder diverticulum or a left ovarian cystic lesion, this has simple fluid characteristics. Disc levels: Aside from the above findings, there is left greater than right foraminal impingement at L5-S1 primarily due to intervertebral spurring. IMPRESSION: 1. Scattered small locules of gas density in the spinal canal at L3-4, at L4, in the left neural foramen at L4-5, along the right facetectomy site at L4-5, and in the soft tissues just to the right of the L4 spinous process tip. Although some of this could conceivably be explainable by nitrogen gas phenomenon within synovial cysts or ectopic disc material, infectious process must be considered a concern and MRI lumbar spine is recommended. Also correlate with any clinical signs  and symptoms of infection. 2. Markedly severe abdominal aortic atherosclerosis with apparent filling of the lumen of the distal abdominal aorta causing at least markedly severe stenosis. 3. 5.6 cm fluid density lesion sitting above the urinary bladder could be from a bladder diverticulum or a left ovarian cyst, with the latter favored. This finding is not fully characterized. Consider follow up pelvic sonography for further characterization. Electronically Signed   By: Van Clines M.D.   On: 10/18/2021 15:22   DG Chest Port 1  View  Result Date: 11/03/2021 CLINICAL DATA:  Questionable sepsis EXAM: PORTABLE CHEST 1 VIEW COMPARISON:  None Available. FINDINGS: Normal cardiac silhouette. Bibasilar mild airspace disease greater on the LEFT. Upper lungs clear. No pneumothorax. No acute osseous abnormality. IMPRESSION: Bibasilar airspace disease suggests pulmonary edema versus infiltrate. Greater density on the LEFT. Electronically Signed   By: Suzy Bouchard M.D.   On: 10/19/2021 15:20    Review of systems not obtained due to patient factors. Blood pressure 120/81, pulse (!) 138, temperature (!) 101.2 F (38.4 C), temperature source Axillary, resp. rate 20, height 5' (1.524 m), weight 73.9 kg, SpO2 98 %. Patient is intubated and sedated with propofol and fentanyl.  She does not awaken to stimulation at present.  Her pupils are pinpoint and minimally reactive.  Her lumbar wound is well-healed.  Motor examination cannot be assessed but was intact per Dr. Ronnald Ramp' evaluation earlier.  Her chest and abdomen appear benign.  Her extremities are very mottled and cool.  Peripheral pulses are weak.  Assessment/Plan: Patient with postoperative lumbar wound infection with secondary septic shock.  Currently given the patient's condition I would recommend continued treatment with broad-spectrum antibiotics and supportive care.  Ideally we would washout this fluid collection to diminish bacterial load however given her  current situation I think that any additional stress would likely decompensate her further.  As the epidural collection is not particularly compressive at present I think we are safe to treat with antibiotics alone.  I will reassess the situation tomorrow.  Please call me with any new updates or problems.  Cooper Render Labresha Mellor 10/17/2021, 8:37 AM

## 2021-11-14 NOTE — Progress Notes (Signed)
  Echocardiogram 2D Echocardiogram has been performed.  Andrea Houston 11-12-21, 8:29 AM

## 2021-11-14 NOTE — Progress Notes (Signed)
PHARMACY - PHYSICIAN COMMUNICATION CRITICAL VALUE ALERT - BLOOD CULTURE IDENTIFICATION (BCID)  Andrea Houston is an 66 y.o. female who presented to [facility] on 10/15/2021. Patient with PLIF L4-L5 on 08/28/21. She started having sharp back pain at surgical site on Monday and pain started to worsen to her legs. MRI positive epidural abscess.  Assessment:  1/3 strep species (not pyogenes, agalactiae, pneumoniae) Could represent contamination, but given clinical picture, may be true pathogen MRI with epidural abscess - neurosurgery following; no immediate plans for OR but likely to go for washout once more stable  Name of physician (or Provider) Contacted: Dr. Celine Mans  Current antibiotics: vancomycin/cefepime/flagyl  Changes to prescribed antibiotics recommended:  Continue vancomycin and cefepime Discontinue flagyl per MD as most likely concern for staph, strep or pseudomonas  Results for orders placed or performed during the hospital encounter of 10/22/2021  Blood Culture ID Panel (Reflexed) (Collected: 11/09/2021  3:20 PM)  Result Value Ref Range   Enterococcus faecalis NOT DETECTED NOT DETECTED   Enterococcus Faecium NOT DETECTED NOT DETECTED   Listeria monocytogenes NOT DETECTED NOT DETECTED   Staphylococcus species NOT DETECTED NOT DETECTED   Staphylococcus aureus (BCID) NOT DETECTED NOT DETECTED   Staphylococcus epidermidis NOT DETECTED NOT DETECTED   Staphylococcus lugdunensis NOT DETECTED NOT DETECTED   Streptococcus species DETECTED (A) NOT DETECTED   Streptococcus agalactiae NOT DETECTED NOT DETECTED   Streptococcus pneumoniae NOT DETECTED NOT DETECTED   Streptococcus pyogenes NOT DETECTED NOT DETECTED   A.calcoaceticus-baumannii NOT DETECTED NOT DETECTED   Bacteroides fragilis NOT DETECTED NOT DETECTED   Enterobacterales NOT DETECTED NOT DETECTED   Enterobacter cloacae complex NOT DETECTED NOT DETECTED   Escherichia coli NOT DETECTED NOT DETECTED   Klebsiella aerogenes NOT  DETECTED NOT DETECTED   Klebsiella oxytoca NOT DETECTED NOT DETECTED   Klebsiella pneumoniae NOT DETECTED NOT DETECTED   Proteus species NOT DETECTED NOT DETECTED   Salmonella species NOT DETECTED NOT DETECTED   Serratia marcescens NOT DETECTED NOT DETECTED   Haemophilus influenzae NOT DETECTED NOT DETECTED   Neisseria meningitidis NOT DETECTED NOT DETECTED   Pseudomonas aeruginosa NOT DETECTED NOT DETECTED   Stenotrophomonas maltophilia NOT DETECTED NOT DETECTED   Candida albicans NOT DETECTED NOT DETECTED   Candida auris NOT DETECTED NOT DETECTED   Candida glabrata NOT DETECTED NOT DETECTED   Candida krusei NOT DETECTED NOT DETECTED   Candida parapsilosis NOT DETECTED NOT DETECTED   Candida tropicalis NOT DETECTED NOT DETECTED   Cryptococcus neoformans/gattii NOT DETECTED NOT DETECTED    Pricilla Riffle, PharmD, BCPS Clinical Pharmacist 09-Nov-2021 11:33 AM

## 2021-11-14 NOTE — Progress Notes (Signed)
NAME:  Andrea Houston, MRN:  [id number], DOB:  10-18-1955, LOS: 1 ADMISSION DATE:  11-09-21, CONSULTATION DATE:  2021-11-09 REFERRING MD:  Rosalia Hammers CHIEF COMPLAINT:  Back pain   History of Present Illness:  Andrea Houston is a 66 y.o. female who has a PMH as below. She had PLIF L4-L5 on 08/28/21 (Dr. Yetta Barre) and surgery went well.  She was discharged home 08/30/21 and had been doing well post operatively. On Monday 7/3, she began to experience sharp back pain at the surgical site. Pain persisted and worsened with radiation into her legs. She then ended up falling due to "legs giving out". She tried to use pain meds at home but had minimal relief.  Due to no changes, her husband called 911 on 7/7. She was subsequently brought to [facility] ED where CT of the lumbar spine showed scattered small gas densities in the spinal canal at L3-4, L4-L5 area. Neurosurgery was consulted and requested MRI lumbar spine before further recs. She has had no drainage from surgical site, states it has healed well. She has had 1 follow up with Dr. Yetta Barre and everything checked out fine. She has had subjective fevers and chills for the past week or so. No fecal or urinary incontinence but has increased urgency and "when I have to go, I have to go NOW". Has had tingling in bilateral legs and feet. She has had decreased PO intake since 7/3 due to pain and "not wanting to do anything".  She had stable BP but HR fluctuating as high as 150's. She is writhing in pain. RR up to 25-30 and she is requesting pain meds. Oral mucosa extremely dry, has significant skin tenting. She has thus far received 2L LR as well as 2g Cefepime and 1g Vancomycin.  PCCM called for admission to ICU for ongoing management.  Pertinent  Medical History:  has S/P lumbar fusion; Sepsis (HCC); Low back pain; and Hypokalemia on their problem list.  Significant Hospital Events: Including procedures, antibiotic start and stop dates in addition to other pertinent events    7/7 admit to ICU. Required emergent intubation for encephalopathy.    Interim History / Subjective:  Intubated at shift change last night. Developed worsening hypotension Ongoing fever. MRI reviewed and confirms epidural abscess.  Tachypnic and uncomfortable.  Objective:  Blood pressure 120/81, pulse (!) 138, temperature (!) 101.2 F (38.4 C), temperature source Axillary, resp. rate 20, height 5' (1.524 m), weight 73.9 kg, SpO2 98 %.    Vent Mode: PRVC FiO2 (%):  [70 %-100 %] 70 % Set Rate:  [22 bmp-30 bmp] 30 bmp Vt Set:  [360 mL] 360 mL PEEP:  [5 cmH20-8 cmH20] 8 cmH20 Plateau Pressure:  [16 cmH20-21 cmH20] 21 cmH20   Intake/Output Summary (Last 24 hours) at 10/29/2021 0839 Last data filed at 11/02/2021 0709 Gross per 24 hour  Intake 7828.15 ml  Output 770 ml  Net 7058.15 ml   Filed Weights   2021/11/09 1736 11/09/21 2232  Weight: 61.2 kg 73.9 kg    Examination: Gen:      Intubated, sedated, acutely ill appearing HEENT:  ETT to vent Lungs:    sounds of mechanical ventilation auscultated no wheezes or crackles CV:         tachycardic, regular Abd:      + bowel sounds; soft, non-tender; no palpable masses, no distension Ext:    Diffuse edema. Bilaterl lower extremities are mottled and cool. Unable to doppler bilateral pedal pulses Skin:  Warm and dry; no rashes Neuro:   sedated, RASS -1, moves all 4 extremities but weakened in lower extremities   Labs/imaging personally reviewed:  CT L spine 7/7 > scattered gas densities in the spinal canal at L3-4, at L4, in the left neural foramen at L4-5, along the right facetectomy site at L4-5, and in the soft tissues just to the right of the L4 spinous process tip. Severe abdominal aortic atherosclerosis with marked severe stenosis of distal abdominal aorta. 5.6cm fluid density above bladder favored to be left ovarian cyst. MRI L spine 7/7 >   Assessment & Plan:   Septic shock Epidural Abscess - Empiric Vanc and Cefepime. Will  stop flagyl. Suspect this is likely staph or psa based on risk factors.  - F/u on blood cultures. - discussed with Dr. Jordan Likes in neurosurgery. Plan will be abx for now and likely surgery with washout once more stable. No immediate OR plans.   Acute hypoxemic respiratory failure Need for sedation for mechanical ventilation - continue fentanyl, add propofol - LTVV, VAP bundle - she is currently breathing over the vent despite sedation and RR Of 30. Likely high Mv needs due to lactic acidosis  Concern for Acute arterial occlusion Severe abdominal aortic atherosclerosis. - mottled extremities, loss of pulses - heparin gtt - lower extremity arterial dopplers  Hypokalemia Hypomagnesemia - still 3.0. will give 80 meq - Mag 1.7. replete - Follow BMP.  Hx CAD s/p stenting x 3 2012, HTN. - Hold home Clopidogrel for now pending surgical procedure.  - Hold home Clonidine.  Hx COPD. - Budesonide/Brovana/Atrovent in lieu of home Trelegy. - quit smoking in April 2023 - prn nebs  Anxiety Disorder - Hold home Lorazepam, Paroxetine, Temazepam.  ? Ovarian cyst - 5.6cm fluid density above bladder on CT L spine. - F/u as outpatient for pelvic US.  Best practice (evaluated daily):  Diet/type: NPO will likely need to start tube feeds.  DVT prophylaxis: prophylactic heparin  GI prophylaxis: N/A Lines: N/A - will need one today Foley:  N/A Code Status:  full code Last date of multidisciplinary goals of care discussion: husband updated at bedside this am.    Critical care time: 55 min.   The patient is critically ill due to shock, respiratory failure.  Critical care was necessary to treat or prevent imminent or life-threatening deterioration.  Critical care was time spent personally by me on the following activities: development of treatment plan with patient and/or surrogate as well as nursing, discussions with consultants, evaluation of patient's response to treatment, examination of patient,  obtaining history from patient or surrogate, ordering and performing treatments and interventions, ordering and review of laboratory studies, ordering and review of radiographic studies, pulse oximetry, re-evaluation of patient's condition and participation in multidisciplinary rounds.   Critical Care Time devoted to patient care services described in this note is 55 minutes. This time reflects time of care of this signee Charlott Holler . This critical care time does not reflect separately billable procedures or procedure time, teaching time or supervisory time of PA/NP/Med student/Med Resident etc but could involve care discussion time.       Charlott Holler [facility] 2021-11-02 8:48 AM  Pager: see AMION  If no response to pager , please call critical care on call (see AMION) until 7pm After 7:00 pm call Elink

## 2021-11-14 NOTE — TOC Initial Note (Signed)
Transition of Care [facility]) - Initial/Assessment Note    Patient Details  Name: Andrea Houston MRN: [id number] Date of Birth: 01-Nov-1955  Transition of Care [facility]) CM/SW Contact:    Golda Acre, RN Phone Number: 11/04/2021, 7:27 AM  Clinical Narrative:                  Transition of Care [facility]) Screening Note   Patient Details  Name: Andrea Houston Date of Birth: Aug 28, 1955   Transition of Care [facility]) CM/SW Contact:    Golda Acre, RN Phone Number: Nov 04, 2021, 7:27 AM    Transition of Care Department [facility]) has reviewed patient and no TOC needs have been identified at this time. We will continue to monitor patient advancement through interdisciplinary progression rounds. If new patient transition needs arise, please place a TOC consult.    Expected Discharge Plan: Home/Self Care Barriers to Discharge: Continued Medical Work up   Patient Goals and CMS Choice Patient states their goals for this hospitalization and ongoing recovery are:: to go home CMS Medicare.gov Compare Post Acute Care list provided to:: Patient    Expected Discharge Plan and Services Expected Discharge Plan: Home/Self Care   Discharge Planning Services: CM Consult   Living arrangements for the past 2 months: Single Family Home                                      Prior Living Arrangements/Services Living arrangements for the past 2 months: Single Family Home Lives with:: Spouse Patient language and need for interpreter reviewed:: Yes Do you feel safe going back to the place where you live?: Yes            Criminal Activity/Legal Involvement Pertinent to Current Situation/Hospitalization: No - Comment as needed  Activities of Daily Living      Permission Sought/Granted                  Emotional Assessment Appearance:: Appears stated age     Orientation: : Oriented to Self, Oriented to Place, Oriented to  Time, Oriented to Situation Alcohol / Substance Use:  Not Applicable Psych Involvement: No (comment)  Admission diagnosis:  Sepsis (HCC) [A41.9] Sepsis, due to unspecified organism, unspecified whether acute organ dysfunction present [facility]) [A41.9] Patient Active Problem List   Diagnosis Date Noted   Sepsis (HCC) 11/06/2021   Low back pain    Hypokalemia    S/P lumbar fusion 08/28/2021   PCP:  Delorse Lek, FNP Pharmacy:   [facility] [address], [place] - [address] [place] [address] Phone: [phone] Fax: [phone]     Social Determinants of Health (SDOH) Interventions    Readmission Risk Interventions     No data to display

## 2021-11-14 NOTE — Death Summary Note (Signed)
DEATH SUMMARY   Patient Details  Name: Andrea Houston MRN: [id number] DOB: 1955/06/24  Admission/Discharge Information   Admit Date:  2021-10-26  Date of Death: Date of Death: 10/27/21  Time of Death: Time of Death: 30-Jul-2218  Length of Stay: 1  Referring Physician: Tempie Hoist, FNP   Reason(s) for Hospitalization  Back pain  Diagnoses  Preliminary cause of death: Acute on chronic limb ischemia Secondary Diagnoses (including complications and co-morbidities):  Principal Problem:   Sepsis [facility]) Peripheral Vascular Disease CAD s/p PCI Epidural Abscess COPD  Brief Hospital Course (including significant findings, care, treatment, and services provided and events leading to death)  Andrea Houston is a 66 y.o. year old female with past medical history of CAD, PVD, Tobacco use disorder and chronic back pain. She had PLIF L4-L5 on 08/28/21 (Dr. Ronnald Ramp) and surgery went well.  She was discharged home 08/30/21 and had been doing well post operatively. On Monday 7/3, she began to experience sharp back pain at the surgical site. Pain persisted and worsened with radiation into her legs. She then ended up falling due to "legs giving out". She tried to use pain meds at home but had minimal relief.   he was subsequently brought to [facility] ED on 10/26/21 where CT of the lumbar spine showed scattered small gas densities in the spinal canal at L3-4, L4-L5 area. She was admitted to ICU for lactic acidosis and tachypnia on admission with respiratory failure. Neurosurgery was consulted and requested MRI lumbar spine which showed epidural abscess.   She received pain meds and abx with 2g Cefepime and 1g Vancomycin.  On arrival to ICU patient was immediately intubated for encephalopathy and respiratory failure. Over the next 24 hours her lower extremities became more cold, mottled, and she had persistent lactic acidosis. Imaging confirmed critical atherosclerotic disease to the lower extremities exacerbated by her  need for vasopressor support. Vascualr surgery was consulted and unfortunately there were no revascularization options for her. Husband elected to make her comfort care and she died peacefully with family at bedside.   Pertinent Labs and Studies  Significant Diagnostic Studies CT Angio Abd/Pel w/ and/or w/o  Addendum Date: Oct 27, 2021   ADDENDUM REPORT: 10/27/2021 14:58 ADDENDUM: Extensive postsurgical changes within the lumbar spine identified. Refer to MRI of the lumbar spine report from Oct 26, 2021 for further details. These results were called by telephone at the time of interpretation on 2021/10/27 at 2:58 pm to provider [facility] , who verbally acknowledged these results. Electronically Signed   By: Kerby Moors M.D.   On: Oct 27, 2021 14:58   Addendum Date: 2021-10-27   ADDENDUM REPORT: Oct 27, 2021 14:40 ADDENDUM: With respect to the occluded segment of the hepatic artery there is evidence of distal reconstitution of the distal hepatic artery (likely secondary to retrograde flow from the gastroduodenal artery.) Electronically Signed   By: Kerby Moors M.D.   On: 27-Oct-2021 14:40   Result Date: October 27, 2021 CLINICAL DATA:  Evaluate for acute mesenteric ischemia. EXAM: CTA ABDOMEN AND PELVIS WITHOUT AND WITH CONTRAST TECHNIQUE: Multidetector CT imaging of the abdomen and pelvis was performed using the standard protocol during bolus administration of intravenous contrast. Multiplanar reconstructed images and MIPs were obtained and reviewed to evaluate the vascular anatomy. RADIATION DOSE REDUCTION: This exam was performed according to the departmental dose-optimization program which includes automated exposure control, adjustment of the mA and/or kV according to patient size and/or use of iterative reconstruction technique. CONTRAST:  123mL OMNIPAQUE IOHEXOL 350 MG/ML SOLN COMPARISON:  None  Available. FINDINGS: VASCULAR Aorta: There is extensive aortic atherosclerotic disease with noncalcified and calcified  irregular pleural plaque identified throughout the abdominal aorta. No signs of aneurysm. Markedly diminished extensive calcified plaque with signs of high-grade stenosis is noted involving the distal abdominal aorta just above the bifurcation. Celiac: Greater than 50% stenosis at the origin of the celiac artery. The hepatic artery appears thrombosed approximately 1.1 cm from the celiac bifurcation, image 67/4. The left gastric artery appears patent. SMA: High-grade stenosis of the proximal SMA is identified on the order of 70%. Renals: There are 2 right renal arteries. Extensive calcified plaque is identified at the origin of both renal arteries with signs of high-grade stenosis. Solitary left renal artery which remains patent exhibits at least 50% stenosis at its origin secondary to calcified plaque. IMA: The IMA appears patent but is markedly diminished in caliber, image 116/4 Inflow: Patent without evidence of aneurysm, dissection, vasculitis or significant stenosis. Proximal Outflow: Extensive atherosclerotic plaque is noted involving bilateral common iliac arteries with signs near complete occlusive of the left common iliac artery and high-grade stenosis involving the right common iliac artery. Bilateral common iliac arteries opacified but are markedly diminished in caliber. There is also high-grade stenoses involving markedly decreased caliber bilateral internal iliac arteries. Veins: The portal vein is patent. No signs of gas within the portal venous system. Review of the MIP images confirms the above findings. NON-VASCULAR Lower chest: Small bilateral pleural effusions. Airspace consolidation is identified within both lower lung zones concerning for multifocal pneumonia and or aspiration. Signs of chronic interstitial lung disease noted with areas of traction bronchiectasis and interstitial fibrotic changes Hepatobiliary: There is no focal liver abnormality identified. Status post cholecystectomy. Moderate  intrahepatic bile duct dilatation. Fusiform dilatation of the common bile duct measures up to 3 mm. No signs of choledocholithiasis. Pancreas: Unremarkable. No pancreatic ductal dilatation or surrounding inflammatory changes. Spleen: Normal in size without focal abnormality. Adrenals/Urinary Tract: Multiple bilateral renal calculi are identified. Within the right kidney there are upper and lower pole renal calculi measuring up to 7 mm. Within the left kidney stones measure up to 7 mm. Bilateral pelvocaliectasis is identified. Mild bilateral perinephric fat stranding is also noted. No signs of obstructing stone within the ureters bilaterally. Urinary bladder is decompressed around a Foley catheter balloon. Stomach/Bowel: NG tube is in the antrum of the stomach. Moderate distension of the gastric lumen with air-fluid level. No pathologic dilatation of the large or small bowel loops. No signs of small bowel wall thickening or inflammation. There are postsurgical changes involving the proximal ascending colon/cecum. Anastomotic suture chain is identified within the ascending colon presumably representing prior in decide enterocolonic anastomosis. Mild wall thickening involving the ascending colon and transverse colon is identified. Extensive sigmoid diverticulosis noted. No definite signs of acute diverticulitis. There is wall thickening involving the partially decompressed descending colon, sigmoid colon and rectum which is nonspecific and may reflect incomplete distension versus colitis. No signs of pneumatosis or bowel perforation. Lymphatic: No signs of abdominopelvic adenopathy. Reproductive: Uterus is unremarkable. Well-circumscribed fluid attenuating structure within the left adnexa measures 5.1 cm. No signs of internal septation or enhancing mural nodule. Other: There is a small volume of free fluid extending along bilateral pericolic gutters. No focal fluid collections identified to suggest abscess.  Musculoskeletal: No acute or significant osseous findings. Postoperative changes from PLIF at L4 through S1 with interbody fusion. Degenerative disc disease noted within the lower thoracic and upper lumbar spine. No acute or suspicious osseous findings.  IMPRESSION: Vascular: 1. Markedly diminished caliber of the abdominal aorta with signs of high-grade stenosis involving the distal abdominal aorta just above the bifurcation. Severe atherosclerotic changes involving the iliac arteries and their branches as detailed above. 2. High-grade stenosis at the origin of the celiac artery and SMA. 3. The hepatic artery appears thrombosed approximately 1.1 cm from the celiac bifurcation. The left gastric artery appears patent. 4. The IMA appears patent but is markedly diminished in caliber. 5. Bilateral renal artery stenosis. Non-vascular: 1. Mild diffuse colonic wall thickening is identified. There are no signs to suggest bowel perforation. No pneumatosis or portal venous gas is identified. Cannot exclude mild colitis. This may be inflammatory or infectious in etiology. Ischemic colitis would be difficult to exclude with a high degree of certainty given the extent of atherosclerotic disease described above. No signs of pneumatosis or portal venous gas. 2. Bilateral lower lobe aspiration versus pneumonia. Small pleural effusions noted. 3. Colonic diverticulosis without signs of acute diverticulitis. 4. Status post cholecystectomy with common bile duct dilatation. No obstructing stone or mass noted. 5. Bilateral nephrolithiasis. There is also bilateral pelvocaliectasis without evidence of obstructing stone. Perinephric fat stranding noted. Correlate for any clinical signs or symptoms of pyelonephritis. 6. 5.1 cm left adnexal simple-appearing cyst. Recommend follow-up pelvic US in 6-12 months. Reference: JACR 2020 Feb;17(2):248-254 Electronically Signed: By: Signa Kell M.D. On: 10/14/2021 14:06   ECHOCARDIOGRAM COMPLETE  BUBBLE STUDY  Result Date: 11/04/2021    ECHOCARDIOGRAM REPORT   Patient Name:   ROSALEA WITHROW Date of Exam: 10/22/2021 Medical Rec #:  [id number]        Height:       60.0 in Accession #:    [id number]       Weight:       162.9 lb Date of Birth:  May 14, 1955        BSA:          1.711 m Patient Age:    66 years         BP:           120/81 mmHg Patient Gender: F                HR:           136 bpm. Exam Location:  Inpatient Procedure: 2D Echo, Cardiac Doppler, Color Doppler and Saline Contrast Bubble            Study STAT ECHO                                 MODIFIED REPORT:  This report was modified by Zoila Shutter MD on 11/09/2021 due to Added Critical                               Result notification.  Indications:     Respiratory failure with hypoxia (HCC) [220254]  History:         Patient has no prior history of Echocardiogram examinations.                  CAD, COPD; Risk Factors:Sleep Apnea.  Sonographer:     Eulah Pont RDCS Referring Phys:  [id number] JESSICA MARSHALL Diagnosing Phys: Zoila Shutter MD IMPRESSIONS  1. Left ventricular ejection fraction, by estimation, is 60 to 65%. Left ventricular ejection fraction by 2D MOD biplane is 63.8 %. The left  ventricle has normal function. The left ventricle has no regional wall motion abnormalities. There is mild left ventricular hypertrophy. Left ventricular diastolic parameters are consistent with Grade I diastolic dysfunction (impaired relaxation).  2. Right ventricular systolic function is normal. The right ventricular size is normal.  3. The mitral valve is grossly normal. No evidence of mitral valve regurgitation.  4. The aortic valve is tricuspid. Aortic valve regurgitation is not visualized.  5. Agitated saline contrast bubble study was negative, with no evidence of any interatrial shunt.  6. Rhythm strip during this exam demonstrates sinus tachycardia. Comparison(s): No prior Echocardiogram. Conclusion(s)/Recommendation(s): Critical findings reported  to Dr. Shearon Stalls and acknowledged at 12:00 pm. FINDINGS  Left Ventricle: Left ventricular ejection fraction, by estimation, is 60 to 65%. Left ventricular ejection fraction by 2D MOD biplane is 63.8 %. The left ventricle has normal function. The left ventricle has no regional wall motion abnormalities. The left ventricular internal cavity size was normal in size. There is mild left ventricular hypertrophy. Left ventricular diastolic parameters are consistent with Grade I diastolic dysfunction (impaired relaxation). Indeterminate filling pressures. Right Ventricle: The right ventricular size is normal. No increase in right ventricular wall thickness. Right ventricular systolic function is normal. Left Atrium: Left atrial size was normal in size. Right Atrium: Right atrial size was normal in size. Pericardium: There is no evidence of pericardial effusion. Mitral Valve: The mitral valve is grossly normal. No evidence of mitral valve regurgitation. Tricuspid Valve: The tricuspid valve is grossly normal. Tricuspid valve regurgitation is mild. Aortic Valve: The aortic valve is tricuspid. Aortic valve regurgitation is not visualized. Pulmonic Valve: The pulmonic valve was normal in structure. Pulmonic valve regurgitation is not visualized. Aorta: The aortic root and ascending aorta are structurally normal, with no evidence of dilitation. Venous: IVC assessment for right atrial pressure unable to be performed due to mechanical ventilation. IAS/Shunts: No atrial level shunt detected by color flow Doppler. Agitated saline contrast was given intravenously to evaluate for intracardiac shunting. Agitated saline contrast bubble study was negative, with no evidence of any interatrial shunt. EKG: Rhythm strip during this exam demonstrates sinus tachycardia.  LEFT VENTRICLE PLAX 2D                        Biplane EF (MOD) LVIDd:         3.70 cm         LV Biplane EF:   Left LVIDs:         2.70 cm                          ventricular LV  PW:         1.10 cm                          ejection LV IVS:        1.00 cm                          fraction by LVOT diam:     1.90 cm                          2D MOD LV SV:         54  biplane is LV SV Index:   32                               63.8 %. LVOT Area:     2.84 cm  LV Volumes (MOD) LV vol d, MOD    49.9 ml A2C: LV vol d, MOD    60.6 ml A4C: LV vol s, MOD    18.7 ml A2C: LV vol s, MOD    21.3 ml A4C: LV SV MOD A2C:   31.2 ml LV SV MOD A4C:   60.6 ml LV SV MOD BP:    36.0 ml RIGHT VENTRICLE RV S prime:     16.30 cm/s TAPSE (M-mode): 1.2 cm LEFT ATRIUM             Index        RIGHT ATRIUM          Index LA diam:        2.90 cm 1.70 cm/m   RA Area:     9.10 cm LA Vol (A2C):   27.7 ml 16.19 ml/m  RA Volume:   17.10 ml 10.00 ml/m LA Vol (A4C):   18.3 ml 10.70 ml/m LA Biplane Vol: 23.7 ml 13.85 ml/m  AORTIC VALVE LVOT Vmax:   147.00 cm/s LVOT Vmean:  95.400 cm/s LVOT VTI:    0.191 m  AORTA Ao Root diam: 3.00 cm Ao Asc diam:  3.20 cm MITRAL VALVE                TRICUSPID VALVE MV Area (PHT): 6.27 cm     TR Peak grad:   33.2 mmHg MV Decel Time: 121 msec     TR Vmax:        288.00 cm/s MV E velocity: 63.60 cm/s MV A velocity: 108.00 cm/s  SHUNTS MV E/A ratio:  0.59         Systemic VTI:  0.19 m                             Systemic Diam: 1.90 cm Lyman Bishop MD Electronically signed by Lyman Bishop MD Signature Date/Time: 10/19/2021/12:07:42 PM    Final (Updated)    VAS [place] LOWER EXTREMITY ARTERIAL DUPLEX  Result Date: 10/24/2021 LOWER EXTREMITY ARTERIAL DUPLEX STUDY Patient Name:  ATZIRY MARKEY  Date of Exam:   10/25/2021 Medical Rec #: [id number]         Accession #:    [id number] Date of Birth: 07/05/1955         Patient Gender: F Patient Age:   63 years Exam Location:  [facility] Procedure:      VAS [place] LOWER EXTREMITY ARTERIAL DUPLEX Referring Phys: JESSICA MARSHALL --------------------------------------------------------------------------------  Indications:  Mottling of lower extremities extending to above umbilicus prior to              start of levophed; now with mottling only affecting bilateral lower              extremities with levophed. Significant narrowing of distal aorta              seen on CT. Dampened flow throughout aorta seen on duplex. High Risk Factors: Hypertension, hyperlipidemia, past history of smoking,                    coronary artery disease.  Current ABI: Not  obtained Comparison Study: No prior study Performing Technologist: Gertie Fey MHA, RDMS, RVT, RDCS  Examination Guidelines: A complete evaluation includes B-mode imaging, spectral Doppler, color Doppler, and power Doppler as needed of all accessible portions of each vessel. Bilateral testing is considered an integral part of a complete examination. Limited examinations for reoccurring indications may be performed as noted.  +----------+--------+-----+--------+------------------------+--------+ RIGHT     PSV cm/sRatioStenosisWaveform                Comments +----------+--------+-----+--------+------------------------+--------+ CFA Distal44                   low resistant monophasic         +----------+--------+-----+--------+------------------------+--------+ DFA       48                   low resistant monophasic         +----------+--------+-----+--------+------------------------+--------+ SFA Prox  48                   low resistant monophasic         +----------+--------+-----+--------+------------------------+--------+ SFA Mid   32                   low resistant monophasic         +----------+--------+-----+--------+------------------------+--------+ SFA Distal34                   low resistant monophasic         +----------+--------+-----+--------+------------------------+--------+ POP Prox  19                   tardus parvus                    +----------+--------+-----+--------+------------------------+--------+ POP  Distal17                   tardus parvus                    +----------+--------+-----+--------+------------------------+--------+ TP Trunk  24                   tardus parvus                    +----------+--------+-----+--------+------------------------+--------+ ATA Mid                occluded                                 +----------+--------+-----+--------+------------------------+--------+ PTA Mid                occluded                                 +----------+--------+-----+--------+------------------------+--------+ PERO Mid               occluded                                 +----------+--------+-----+--------+------------------------+--------+  +----------+--------+-----+--------+----------------------+--------------------+ LEFT      PSV cm/sRatioStenosisWaveform              Comments             +----------+--------+-----+--------+----------------------+--------------------+ CFA Distal54                   low resistant  monophasic                                 +----------+--------+-----+--------+----------------------+--------------------+ DFA       70                   low resistant                                                             monophasic                                 +----------+--------+-----+--------+----------------------+--------------------+ SFA Prox  50                   low resistant                                                             monophasic                                 +----------+--------+-----+--------+----------------------+--------------------+ SFA Mid   63                   low resistant                                                             monophasic                                 +----------+--------+-----+--------+----------------------+--------------------+ SFA Distal21                    tardus parvus                              +----------+--------+-----+--------+----------------------+--------------------+ POP Distal                     no apparent waveform                       +----------+--------+-----+--------+----------------------+--------------------+ TP Trunk                       tardus parvus                              +----------+--------+-----+--------+----------------------+--------------------+ ATA Mid                                              Unable to visualize. +----------+--------+-----+--------+----------------------+--------------------+  PTA Mid                occluded                                           +----------+--------+-----+--------+----------------------+--------------------+ PERO Mid               occluded                                           +----------+--------+-----+--------+----------------------+--------------------+  Summary: Right: Total occlusion noted in the anterior tibial artery. Total occlusion noted in the posterior tibial artery. Total occlusion noted in the peroneal artery. Abnormal waveforms noted throughout remaining patent arteries; etiology is unknown. Left: Total occlusion noted in the posterior tibial artery. Total occlusion noted in the peroneal artery. Abnormal waveforms noted throughout remaining patent arteries; etiology is unknown.   See table(s) above for measurements and observations. Electronically signed by Orlie Pollen on 10/22/2021 at 11:22:58 AM.    Final    VAS US AORTA/IVC/ILIACS  Result Date: 11/10/2021 ABDOMINAL AORTA STUDY Patient Name:  JC YARWOOD  Date of Exam:   10/24/2021 Medical Rec #: [id number]         Accession #:    [id number] Date of Birth: 01-May-1955         Patient Gender: F Patient Age:   42 years Exam Location:  [facility] Procedure:      VAS US AORTA/IVC/ILIACS Referring Phys: JESSICA MARSHALL  --------------------------------------------------------------------------------  Indications: Distal aorta narrowing seen on CT. Mottling of lower extremities              extending to above umbilicus prior to start of levophed; now with              mottling only affecting bilateral lower extremities with levophed. Risk Factors: Hypertension, hyperlipidemia, past history of smoking, coronary               artery disease. Limitations: Air/bowel gas and obesity.  Comparison Study: No prior study Performing Technologist: Maudry Mayhew MHA, RDMS, RVT, RDCS  Examination Guidelines: A complete evaluation includes B-mode imaging, spectral Doppler, color Doppler, and power Doppler as needed of all accessible portions of each vessel. Bilateral testing is considered an integral part of a complete examination. Limited examinations for reoccurring indications may be performed as noted.  Abdominal Aorta Findings: +-------------+-------+----------+----------+--------+--------+--------+ Location     AP (cm)Trans (cm)PSV (cm/s)WaveformThrombusComments +-------------+-------+----------+----------+--------+--------+--------+ Proximal                      15        thumped                  +-------------+-------+----------+----------+--------+--------+--------+ Mid                           15        thumped                  +-------------+-------+----------+----------+--------+--------+--------+ Distal                        21        thumped                  +-------------+-------+----------+----------+--------+--------+--------+  RT CIA Prox                   13        thumped                  +-------------+-------+----------+----------+--------+--------+--------+ RT CIA Mid                              thumped                  +-------------+-------+----------+----------+--------+--------+--------+ RT CIA Distal                           thumped                   +-------------+-------+----------+----------+--------+--------+--------+ RT EIA Prox                             thumped                  +-------------+-------+----------+----------+--------+--------+--------+ LT CIA Prox                             thumped                  +-------------+-------+----------+----------+--------+--------+--------+ LT EIA Prox                   13        thumped                  +-------------+-------+----------+----------+--------+--------+--------+  Summary: Stenosis: Significantly diminished flow throughout aorta and bilateral iliac arteries with thumped flow.  *See table(s) above for measurements and observations.  Electronically signed by Orlie Pollen on 11/12/2021 at 11:21:55 AM.    Final    DG CHEST PORT 1 VIEW  Result Date: 11/12/2021 CLINICAL DATA:  Central line placement. EXAM: PORTABLE CHEST 1 VIEW COMPARISON:  Chest x-ray 7 8 23. FINDINGS: Prominent lung markings. Mildly increased bibasilar opacities. Similar position of right IJ central venous catheter and endotracheal tube. Gastric tube courses below the diaphragm with the tip outside the field of view. Polyarticular degenerative change. Unchanged cardiomediastinal silhouette. IMPRESSION: Mildly increased bibasilar opacities, which could represent atelectasis and/or pneumonia. Electronically Signed   By: Margaretha Sheffield M.D.   On: 11/02/2021 11:21   DG Chest Port 1 View  Result Date: 11/10/2021 CLINICAL DATA:  Respiratory failure. EXAM: PORTABLE CHEST 1 VIEW COMPARISON:  10/16/2021 FINDINGS: Stable position of ET tube and enteric tube. Aortic atherosclerotic calcifications. Cardiomediastinal contours are stable. Lung volumes are low. Unchanged left pleural effusion. Stable pulmonary edema. Decreased aeration to both lung bases identified which may represent areas of atelectasis or airspace disease. This appears unchanged from the previous exam. IMPRESSION: 1. Stable support apparatus. 2. No  change in aeration a lungs compared with the previous exam. Electronically Signed   By: Kerby Moors M.D.   On: 10/28/2021 06:38   MR Lumbar Spine W Wo Contrast  Result Date: 10/17/2021 CLINICAL DATA:  Low back pain prior surgery and new symptoms EXAM: MRI LUMBAR SPINE WITHOUT AND WITH CONTRAST TECHNIQUE: Multiplanar and multiecho pulse sequences of the lumbar spine were obtained without and with intravenous contrast. CONTRAST:  83mL GADAVIST GADOBUTROL 1 MMOL/ML IV SOLN COMPARISON:  04/28/2021 FINDINGS: Segmentation:  Standard. Alignment: Grade  1 retrolisthesis at L1-2, L2-3 and L3-4 with grade 1 anterolisthesis at L4-5. Vertebrae: L4-S1 PLIF with posterior decompression. There is a dorsal epidural collection at the L3-4 levels measuring approximately 5 mm in thickness and contributing to moderate thecal sac attenuation. There is also fluid surrounding the surgical hardware, particularly at the L4 level. There is multifocal magnetic susceptibility effect within the collection in the posterior paraspinous soft tissues, likely gas. Conus medullaris and cauda equina: Conus extends to the L2 level. Conus and cauda equina appear normal. Paraspinal and other soft tissues: Posterior paraspinal fluid collection at the postoperative levels. Disc levels: L1-L2: Small disc bulge. Right lateral recess narrowing without central spinal canal stenosis. No neural foraminal stenosis. L2-L3: Mild facet hypertrophy. No disc herniation. No spinal canal stenosis. No neural foraminal stenosis. L3-L4: Small disc bulge and mild facet hypertrophy. Moderate spinal canal stenosis. No neural foraminal stenosis. L4-L5: PLIF. Attenuation of the thecal sac without bony spinal canal stenosis. No neural foraminal stenosis. L5-S1: PLIF large central disc extrusion with superior migration, unchanged. Left lateral recess narrowing without central spinal canal stenosis. No neural foraminal stenosis. Visualized sacrum: Normal. IMPRESSION: 1. Dorsal  epidural collection at the L3-4 levels measuring approximately 5 mm in thickness and contributing to moderate thecal sac attenuation. This is likely a small epidural abscess. 2. Moderate thecal sac attenuation at the L4 level due to dorsal epidural and paraspinous fluid. 3. Posterior paraspinous fluid collection may also be infected. 4. Unchanged L5-S1 large central disc extrusion with superior migration. 5. Moderate L3-4 spinal canal stenosis secondary to combination of disc bulge and facet arthrosis. Electronically Signed   By: Ulyses Jarred M.D.   On: 10/14/2021 23:12   DG Abd 1 View  Result Date: 11/13/2021 CLINICAL DATA:  Check gastric catheter placement EXAM: ABDOMEN - 1 VIEW COMPARISON:  None FINDINGS: Gastric catheter is noted within the stomach. Postsurgical changes are noted in the lower lumbar spine. No obstructive changes are seen. IMPRESSION: Gastric catheter within the stomach. Electronically Signed   By: Inez Catalina M.D.   On: 10/24/2021 20:27   Portable Chest x-ray  Result Date: 10/24/2021 CLINICAL DATA:  Sepsis EXAM: PORTABLE CHEST 1 VIEW COMPARISON:  10/25/2021, 3:09 p.m. FINDINGS: Cardiomegaly. Diffuse bilateral interstitial pulmonary opacity and probable small layering left pleural effusion. Endotracheal intubation, tube tip above the trachea. Esophagogastric tube with tip and side port below the diaphragm. IMPRESSION: 1. Endotracheal intubation, tube tip above the trachea. Esophagogastric tube with tip and side port below the diaphragm. 2. Diffuse bilateral interstitial pulmonary opacity and probable small layering left pleural effusion, most consistent with edema in the setting cardiomegaly. Electronically Signed   By: Delanna Ahmadi M.D.   On: 11/01/2021 19:40   CT Lumbar Spine Wo Contrast  Result Date: 11/01/2021 CLINICAL DATA:  Low back pain.  L5-S1 fusion on 08/28/2021 EXAM: CT LUMBAR SPINE WITHOUT CONTRAST TECHNIQUE: Multidetector CT imaging of the lumbar spine was performed  without intravenous contrast administration. Multiplanar CT image reconstructions were also generated. RADIATION DOSE REDUCTION: This exam was performed according to the departmental dose-optimization program which includes automated exposure control, adjustment of the mA and/or kV according to patient size and/or use of iterative reconstruction technique. COMPARISON:  MRI 04/28/2021 FINDINGS: Segmentation: The lowest lumbar type non-rib-bearing vertebra is labeled as L5. Alignment: 6 mm anterolisthesis at L4-5, as was present on the prior MRI from 04/28/2021. Vertebrae: Posterolateral rod and pedicle screw fixation at L4-L5-S1 with interbody graft material and interbody spacers noted. Prior facetectomies at L4-5 and  L5-S1. Small locules of gas density in the spinal canal the L3-4 level on image sixty-five series 3, I am uncertain if these are contained in synovial cysts/disc material or not. Additional small focus of left anterior epidural gas at the L4 vertebral level on image 72 series 3 and along the right facetectomy site at L4-5. Trace amount of gas to the right of the L4 spinous process tip on image 73 series 3. Speckled gas noted in the left neural foraminal region at L4-5. Loss of intervertebral disc height along with degenerative endplate findings at all levels between T12-L3. Paraspinal and other soft tissues: Bilateral nonobstructive nephrolithiasis. Severe atherosclerosis with substantial calcification apparently filling the lumen of the distal abdominal aorta above the bifurcation for example on image 67 series 3. Above the urinary bladder, there is a 5.6 by 4.8 cm fluid density lesion which could be a bladder diverticulum or a left ovarian cystic lesion, this has simple fluid characteristics. Disc levels: Aside from the above findings, there is left greater than right foraminal impingement at L5-S1 primarily due to intervertebral spurring. IMPRESSION: 1. Scattered small locules of gas density in the  spinal canal at L3-4, at L4, in the left neural foramen at L4-5, along the right facetectomy site at L4-5, and in the soft tissues just to the right of the L4 spinous process tip. Although some of this could conceivably be explainable by nitrogen gas phenomenon within synovial cysts or ectopic disc material, infectious process must be considered a concern and MRI lumbar spine is recommended. Also correlate with any clinical signs and symptoms of infection. 2. Markedly severe abdominal aortic atherosclerosis with apparent filling of the lumen of the distal abdominal aorta causing at least markedly severe stenosis. 3. 5.6 cm fluid density lesion sitting above the urinary bladder could be from a bladder diverticulum or a left ovarian cyst, with the latter favored. This finding is not fully characterized. Consider follow up pelvic sonography for further characterization. Electronically Signed   By: Gaylyn RongWalter  Liebkemann M.D.   On: 11/01/2021 15:22   DG Chest Port 1 View  Result Date: 11/04/2021 CLINICAL DATA:  Questionable sepsis EXAM: PORTABLE CHEST 1 VIEW COMPARISON:  None Available. FINDINGS: Normal cardiac silhouette. Bibasilar mild airspace disease greater on the LEFT. Upper lungs clear. No pneumothorax. No acute osseous abnormality. IMPRESSION: Bibasilar airspace disease suggests pulmonary edema versus infiltrate. Greater density on the LEFT. Electronically Signed   By: Genevive BiStewart  Edmunds M.D.   On: 10/29/2021 15:20    Microbiology Recent Results (from the past 240 hour(s))  Blood Culture (routine x 2)     Status: None (Preliminary result)   Collection Time: 10/15/2021  3:20 PM   Specimen: BLOOD  Result Value Ref Range Status   Specimen Description   Final    BLOOD SITE NOT SPECIFIED Performed at [facility], [address] [place], [place] [address]    Special Requests   Final    BOTTLES DRAWN AEROBIC AND ANAEROBIC Blood Culture results may not be optimal due to an inadequate volume  of blood received in culture bottles Performed at [facility], [address] [place], [place] [address]    Culture  Setup Time   Final    GRAM POSITIVE COCCI IN CHAINS IN BOTH AEROBIC AND ANAEROBIC BOTTLES CRITICAL RESULT CALLED TO, READ BACK BY AND VERIFIED WITH: PHARMD A.ALIGTON AT 1116 ON 03/25/22 BY T.SAAD. Performed at [facility], [address] [place], [place] [address]  Culture GRAM POSITIVE COCCI  Final   Report Status PENDING  Incomplete  Blood Culture ID Panel (Reflexed)     Status: Abnormal   Collection Time: 10/15/2021  3:20 PM  Result Value Ref Range Status   Enterococcus faecalis NOT DETECTED NOT DETECTED Final   Enterococcus Faecium NOT DETECTED NOT DETECTED Final   Listeria monocytogenes NOT DETECTED NOT DETECTED Final   Staphylococcus species NOT DETECTED NOT DETECTED Final   Staphylococcus aureus (BCID) NOT DETECTED NOT DETECTED Final   Staphylococcus epidermidis NOT DETECTED NOT DETECTED Final   Staphylococcus lugdunensis NOT DETECTED NOT DETECTED Final   Streptococcus species DETECTED (A) NOT DETECTED Final    Comment: Not Enterococcus species, Streptococcus agalactiae, Streptococcus pyogenes, or Streptococcus pneumoniae. CRITICAL RESULT CALLED TO, READ BACK BY AND VERIFIED WITH: PHARMD A.ALIGTON AT 1116 ON 29-Oct-2021 BY T.SAAD.    Streptococcus agalactiae NOT DETECTED NOT DETECTED Final   Streptococcus pneumoniae NOT DETECTED NOT DETECTED Final   Streptococcus pyogenes NOT DETECTED NOT DETECTED Final   A.calcoaceticus-baumannii NOT DETECTED NOT DETECTED Final   Bacteroides fragilis NOT DETECTED NOT DETECTED Final   Enterobacterales NOT DETECTED NOT DETECTED Final   Enterobacter cloacae complex NOT DETECTED NOT DETECTED Final   Escherichia coli NOT DETECTED NOT DETECTED Final   Klebsiella aerogenes NOT DETECTED NOT DETECTED Final   Klebsiella oxytoca NOT DETECTED NOT DETECTED Final   Klebsiella pneumoniae NOT DETECTED NOT  DETECTED Final   Proteus species NOT DETECTED NOT DETECTED Final   Salmonella species NOT DETECTED NOT DETECTED Final   Serratia marcescens NOT DETECTED NOT DETECTED Final   Haemophilus influenzae NOT DETECTED NOT DETECTED Final   Neisseria meningitidis NOT DETECTED NOT DETECTED Final   Pseudomonas aeruginosa NOT DETECTED NOT DETECTED Final   Stenotrophomonas maltophilia NOT DETECTED NOT DETECTED Final   Candida albicans NOT DETECTED NOT DETECTED Final   Candida auris NOT DETECTED NOT DETECTED Final   Candida glabrata NOT DETECTED NOT DETECTED Final   Candida krusei NOT DETECTED NOT DETECTED Final   Candida parapsilosis NOT DETECTED NOT DETECTED Final   Candida tropicalis NOT DETECTED NOT DETECTED Final   Cryptococcus neoformans/gattii NOT DETECTED NOT DETECTED Final    Comment: Performed at [facility], [address] [place], [place] [address]  Blood Culture (routine x 2)     Status: None (Preliminary result)   Collection Time: 11/02/2021  3:25 PM   Specimen: BLOOD  Result Value Ref Range Status   Specimen Description   Final    BLOOD SITE NOT SPECIFIED Performed at [facility], [address], [place], [place] [address]    Special Requests   Final    BOTTLES DRAWN AEROBIC ONLY Blood Culture adequate volume Performed at [facility], [address], [place], [place] [address]    Culture  Setup Time   Final    GRAM POSITIVE COCCI AEROBIC BOTTLE ONLY CRITICAL VALUE NOTED.  VALUE IS CONSISTENT WITH PREVIOUSLY REPORTED AND CALLED VALUE. Performed at [facility], [address], [place], [place] [address]    Culture GRAM POSITIVE COCCI  Final   Report Status PENDING  Incomplete  MRSA Next Gen by PCR, Nasal     Status: None   Collection Time: 10/24/2021  7:36 PM   Specimen: Nasal Mucosa; Nasal Swab  Result Value Ref Range Status   MRSA by PCR Next Gen NOT DETECTED NOT DETECTED Final    Comment: (NOTE) The GeneXpert MRSA Assay  (FDA approved for NASAL specimens  only), is one component of a comprehensive MRSA colonization surveillance program. It is not intended to diagnose MRSA infection nor to guide or monitor treatment for MRSA infections. Test performance is not FDA approved in patients less than 72 years old. Performed at [facility], [address] [place], [place] [address]   Resp Panel by RT-PCR (Flu A&B, Covid)     Status: None   Collection Time: 11/11/2021  8:47 PM  Result Value Ref Range Status   SARS Coronavirus 2 by RT PCR NEGATIVE NEGATIVE Final    Comment: (NOTE) SARS-CoV-2 target nucleic acids are NOT DETECTED.  The SARS-CoV-2 RNA is generally detectable in upper respiratory specimens during the acute phase of infection. The lowest concentration of SARS-CoV-2 viral copies this assay can detect is 138 copies/mL. A negative result does not preclude SARS-Cov-2 infection and should not be used as the sole basis for treatment or other patient management decisions. A negative result may occur with  improper specimen collection/handling, submission of specimen other than nasopharyngeal swab, presence of viral mutation(s) within the areas targeted by this assay, and inadequate number of viral copies(<138 copies/mL). A negative result must be combined with clinical observations, patient history, and epidemiological information. The expected result is Negative.  Fact Sheet for Patients:  BloggerCourse.com  Fact Sheet for Healthcare Providers:  SeriousBroker.it  This test is no t yet approved or cleared by the [place] FDA and  has been authorized for detection and/or diagnosis of SARS-CoV-2 by FDA under an Emergency Use Authorization (EUA). This EUA will remain  in effect (meaning this test can be used) for the duration of the COVID-19 declaration under Section 564(b)(1) of the Act, 21 U.S.C.section 360bbb-3(b)(1), unless  the authorization is terminated  or revoked sooner.       Influenza A by PCR NEGATIVE NEGATIVE Final   Influenza B by PCR NEGATIVE NEGATIVE Final    Comment: (NOTE) The Xpert Xpress SARS-CoV-2/FLU/RSV plus assay is intended as an aid in the diagnosis of influenza from Nasopharyngeal swab specimens and should not be used as a sole basis for treatment. Nasal washings and aspirates are unacceptable for Xpert Xpress SARS-CoV-2/FLU/RSV testing.  Fact Sheet for Patients: BloggerCourse.com  Fact Sheet for Healthcare Providers: SeriousBroker.it  This test is not yet approved or cleared by the [place] FDA and has been authorized for detection and/or diagnosis of SARS-CoV-2 by FDA under an Emergency Use Authorization (EUA). This EUA will remain in effect (meaning this test can be used) for the duration of the COVID-19 declaration under Section 564(b)(1) of the Act, 21 U.S.C. section 360bbb-3(b)(1), unless the authorization is terminated or revoked.  Performed at [facility], [address] [place], [place] [address]     Lab Basic Metabolic Panel: Recent Labs  Lab 11/13/2021 1431 2021-11-19 0031  NA 138 144  K 3.1* 3.0*  CL 107 104  CO2 17* 23  GLUCOSE 86 70  BUN 9 10  CREATININE 0.92 0.99  CALCIUM 9.7 8.2*  MG  --  1.7  PHOS  --  5.1*   Liver Function Tests: Recent Labs  Lab 10/30/2021 1431  AST 166*  ALT 37  ALKPHOS 91  BILITOT 0.9  PROT 6.7  ALBUMIN 2.6*   No results for input(s): "LIPASE", "AMYLASE" in the last 168 hours. No results for input(s): "AMMONIA" in the last 168 hours. CBC: Recent Labs  Lab 11/01/2021 1431 19-Nov-2021 0031  WBC 10.6* 20.4*  NEUTROABS 9.5*  --   HGB  10.0* 9.3*  HCT 31.3* 29.8*  MCV 79.8* 81.9  PLT 413* 334   Cardiac Enzymes: No results for input(s): "CKTOTAL", "CKMB", "CKMBINDEX", "TROPONINI" in the last 168 hours. Sepsis Labs: Recent Labs  Lab  10/26/2021 1431 11/11/2021 1925 10/14/2021 1933 11/02/2021 0031 11/12/2021 1042  PROCALCITON  --  23.63  --   --   --   WBC 10.6*  --   --  20.4*  --   LATICACIDVEN 8.3*  --  >9.0* 8.3* >9.0*

## 2021-11-14 NOTE — Progress Notes (Signed)
Brief Nutrition Note:   Dietitian consult received for assessment, TF initiation and management.   Chart reviewed; discussed pt with bedside RN. Pt is critically ill, sedated on vent  Pt with persistent lactic acidosis, lactic acid >9.0  Cuff pressures with diastolic <50, MAP not consistently >65 with increasing pressor requirements. No a-line . Given this, pt not currently stable for initiation of TF at this time.   Noted vascular consulted for bilateral LE mottled and cold. Vascular offered bilateral AKA as a life-saving measure; husband does not think patient would want this and pt to transition to comfort care  No further interventions warranted at this time. Please re-consult as needed.   Romelle Starcher MS, RDN, LDN, CNSC Registered Dietitian 3 Clinical Nutrition RD Pager and On-Call Pager Number Located in [place]

## 2021-11-14 NOTE — Progress Notes (Signed)
PCCM Progress Update  Patient with persistent lactic acidosis and cold mottled extremities.  Sent for CT Angio with concern for mesenteric ischemia. Discussed case with both radiology and IR. No high grade occlusion in the abdomen but severe diffuse atherosclerotic disease with near complete occlusion at the aorto-iliac junction. This likely explains her cold mottled lower extremities with persistent lactic acidosis. Already on empiric heparin gtt. Will consult vascular surgery.   She also had a small positional cuff-leak of about 20-40cc which seems to have resolved. Retaining good Vt now. Will defer ETT exchange for now.   Husband updated at bedside about her critical condition. I am concerned that even if she does have an epidural abscess with sepsis component, her critical limb ischemia may prevent her from being stable enough for definitive treatment. I have asked him to call her family in given how sick she is. She may not survive this hospital stay.   Additional CC time 45 minutes.   Durel Salts, MD Pulmonary and Critical Care Medicine [facility] 10/17/2021 3:55 PM Pager: see AMION  If no response to pager, please call critical care on call (see AMION) until 7pm After 7:00 pm call Elink

## 2021-11-14 NOTE — Procedures (Signed)
Extubation Procedure Note  Patient Details:   Name: Andrea Houston DOB: 1955-08-21 MRN: [id number]   Airway Documentation:  Airway 7.5 mm (Active)  Secured at (cm) 23 cm 10/30/2021 1538  Measured From Lips 11/09/2021 1538  Secured Location Right 11/09/2021 1538  Secured By [employer] 10/19/2021 1538  Tube Holder Repositioned Yes 11/13/2021 1538  Prone position No 10/17/2021 1538  Cuff Pressure (cm H2O) Clear OR 27-39 CmH2O 10/30/2021 0827  Site Condition Dry 11/03/2021 1538   Vent end date: 10/15/2021 Vent end time: 1945   Evaluation  O2 sats: currently acceptable Complications: No apparent complications Patient did tolerate procedure well. Bilateral Breath Sounds: Diminished   No Pt extubated to 4LNC. Pt stayed stable. Family bedside.   Max Sane 10/18/2021, 7:52 PM

## 2021-11-14 NOTE — Procedures (Addendum)
Central Venous Catheter Insertion Procedure Note  ANIYIAH ZELL  [id number]  1956/02/05  Date:11/09/2021  Time:9:28 AM   Provider Performing:Ramyah Pankowski Humphrey Rolls   Procedure: Insertion of Non-tunneled Central Venous [phone]) with US guidance (58592)   Indication(s) Medication administration and Difficult access  Consent Risks of the procedure as well as the alternatives and risks of each were explained to the patient and/or caregiver.  Consent for the procedure was obtained and is signed in the bedside chart  Anesthesia Topical only with 1% lidocaine   Timeout Verified patient identification, verified procedure, site/side was marked, verified correct patient position, special equipment/implants available, medications/allergies/relevant history reviewed, required imaging and test results available.  Sterile Technique Maximal sterile technique including full sterile barrier drape, hand hygiene, sterile gown, sterile gloves, mask, hair covering, sterile ultrasound probe cover (if used).  Procedure Description Area of catheter insertion was cleaned with chlorhexidine and draped in sterile fashion.  With real-time ultrasound guidance a central venous catheter was placed into the right internal jugular vein. Nonpulsatile blood flow and easy flushing noted in all ports.  The catheter was sutured in place and sterile dressing applied.  Complications/Tolerance None; patient tolerated the procedure well. Chest X-ray is ordered to verify placement for internal jugular or subclavian cannulation.   Chest x-ray is not ordered for femoral cannulation.  EBL Minimal  Specimen(s) None

## 2021-11-14 NOTE — Progress Notes (Signed)
This chaplain responded to RN-Ethan's page for family support during the time of the Pt. compassionate extubation. The chaplain arrived at the hospital and received an update from the RN. The Pt. husband-Harry is at the bedside, along with other family members.  The chaplain listened reflectively as the family participated in story telling and holding the peace in the promise of no more suffering. The family gathered around the Pt. for prayer before the extubation. The family is discussing their plans for a funeral home.  The chaplain offered F/U spiritual care as needed.  Chaplain Stephanie Acre [phone]

## 2021-11-14 NOTE — Progress Notes (Signed)
eLink Physician-Brief Progress Note Patient Name: Andrea Houston DOB: 07-20-1955 MRN: [id number]   Date of Service  October 28, 2021  HPI/Events of Note  MRI findings: 1. Dorsal epidural collection at the L3-4 levels measuring approximately 5 mm in thickness and contributing to moderate thecal sac attenuation. This is likely a small epidural abscess. 2. Moderate thecal sac attenuation at the L4 level due to dorsal epidural and paraspinous fluid. 3. Posterior paraspinous fluid collection may also be infected. 4. Unchanged L5-S1 large central disc extrusion with superior migration. 5. Moderate L3-4 spinal canal stenosis secondary to combination of disc bulge and facet arthrosis.  eICU Interventions  Plan: Continue Vancomycin, Cefepime and Flagyl. Will consult Neurosurgery.     Intervention Category Major Interventions: Other:  Lenell Antu 2021/10/28, 12:13 AM

## 2021-11-14 NOTE — Plan of Care (Addendum)
Patient expired. Family at bedside. This RN and Toni Amend, RN pronounced patient.

## 2021-11-14 NOTE — Progress Notes (Addendum)
eLink Physician-Brief Progress Note Patient Name: Andrea Houston DOB: [birth year] MRN: [id number]   Date of Service  10/19/2021  HPI/Events of Note  Multiple issues: 1. Nursing reports absent DP and PT pulses by doppler in both lower extremities. Arterial US already ordered by Dr. Gaynell Face. 2. Hypotension - BP = 81/54 with MAP = 62. No central line. 3. RT unable to obtain ABG. 4. Hypokalemia - K+ = 3.0 and Creatinine = 0.99. 5. Lactic Acid = >9.0 --> 8.3 (trending down). 6. Agitation - Nursing request for bilateral wrist restraints.   eICU Interventions  Plan: Norepinephrine IV infusion via PIV. Titrate to MAP > 65.  Will notify PCCM ground team of need for central venous line.  VBG STAT. Will replace K+. Bilateral soft wrist restrains X 8 hours.     Intervention Category Major Interventions: Other:;Hypotension - evaluation and management  Aayliah Rotenberry [place] 10/27/2021, 1:22 AM

## 2021-11-14 DEATH — deceased

## 2022-06-14 IMAGING — CR DG LUMBAR SPINE 2-3V
3 series · 3 of 3 positions shown · non-contrast
Comparison: None.

CLINICAL DATA: Five months of back and bilateral leg pain.

EXAM:
LUMBAR SPINE - 2-3 VIEW

[t l spine  ap]
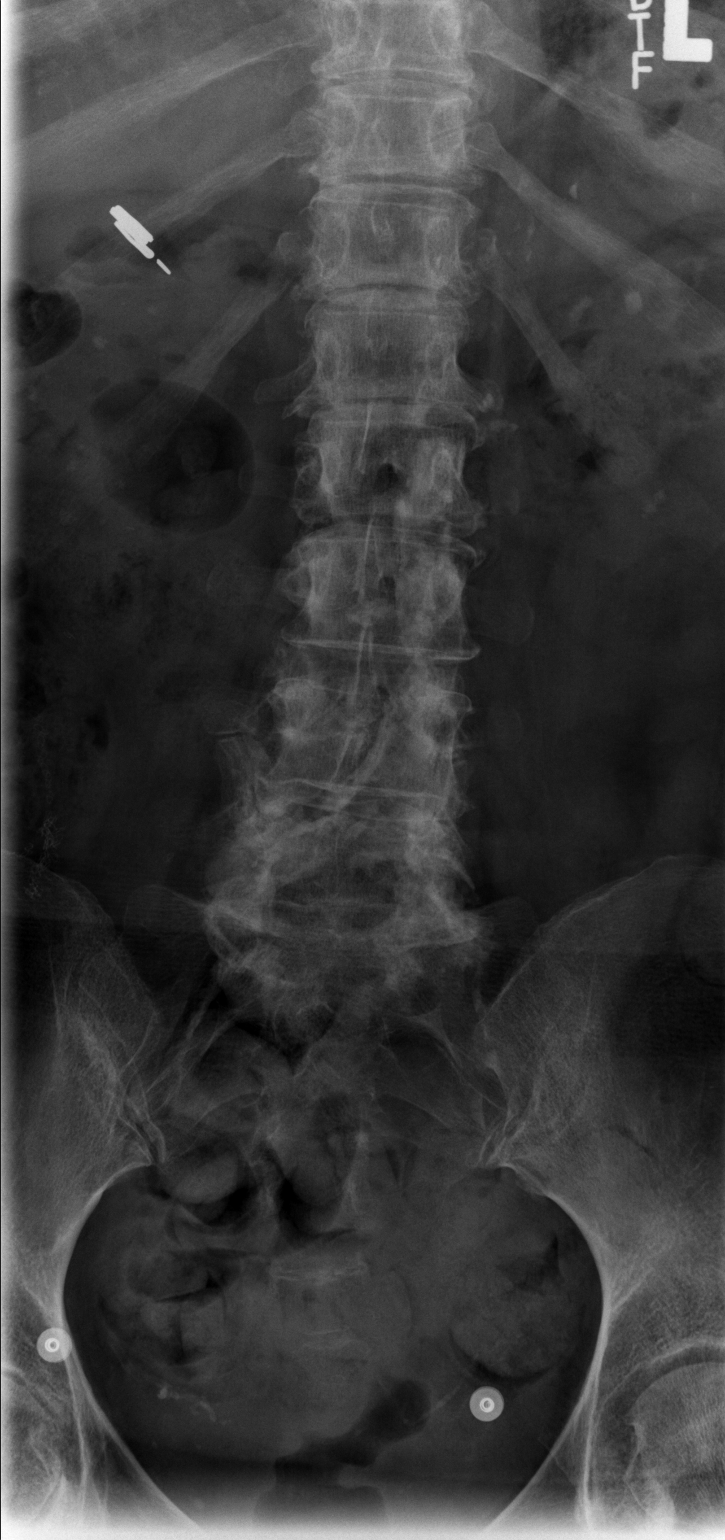

[t  l spine lateral]
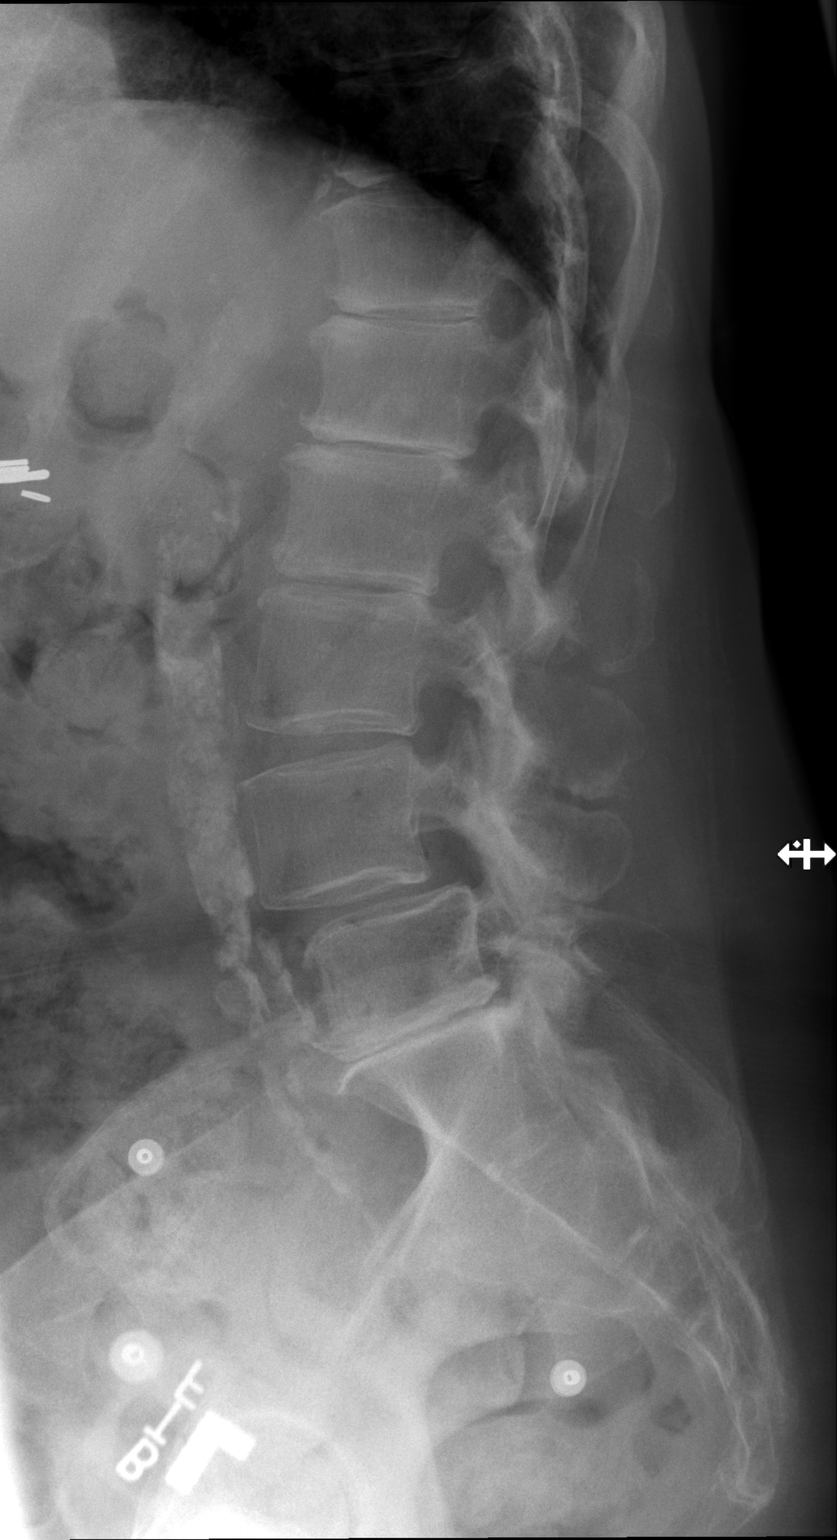

[t spot lateral]
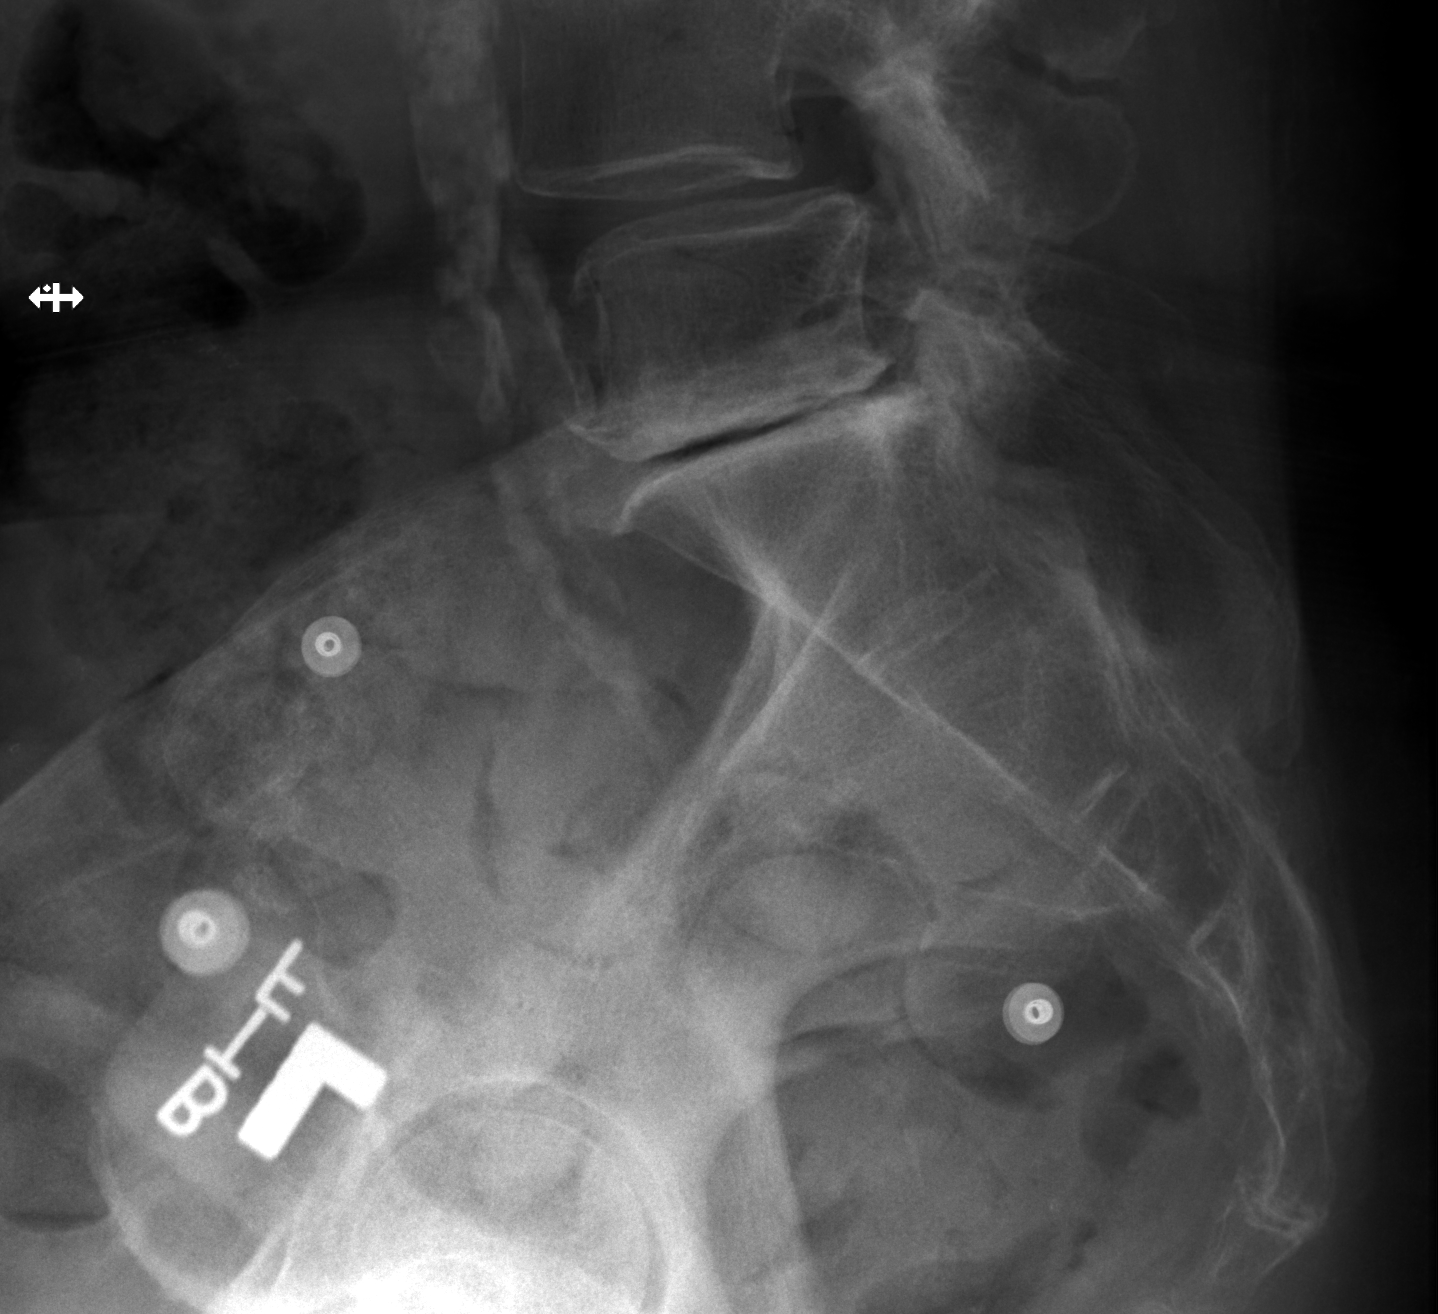

[3 of 3 positions shown; findings below may reference images not displayed]

FINDINGS: There is no evidence of lumbar spine fracture. Five lumbar type
vertebral bodies. Multilevel disc space narrowing and facet
hypertrophy most notably at L5-S1 which produces neural foraminal
impingement. Grade 1 L4 on L5 degenerative anterolisthesis.
Cholecystectomy clips. Aortic atherosclerosis.
IMPRESSION: Multilevel degenerative changes of the lumbar spine. No acute
osseous abnormality.
